# Patient Record
Sex: Male | Born: 1975
Health system: Southern US, Community
[De-identification: ages and names within clinical notes are randomized; demographics above are authoritative.]

## PROBLEM LIST (undated history)

## (undated) DIAGNOSIS — F329 Major depressive disorder, single episode, unspecified: Secondary | ICD-10-CM

## (undated) DIAGNOSIS — R739 Hyperglycemia, unspecified: Secondary | ICD-10-CM

## (undated) DIAGNOSIS — E785 Hyperlipidemia, unspecified: Secondary | ICD-10-CM

## (undated) DIAGNOSIS — F419 Anxiety disorder, unspecified: Secondary | ICD-10-CM

## (undated) DIAGNOSIS — F32A Depression, unspecified: Secondary | ICD-10-CM

## (undated) DIAGNOSIS — K219 Gastro-esophageal reflux disease without esophagitis: Secondary | ICD-10-CM

## (undated) HISTORY — DX: Hyperglycemia, unspecified: R73.9

## (undated) HISTORY — DX: Major depressive disorder, single episode, unspecified: F32.9

## (undated) HISTORY — DX: Hyperlipidemia, unspecified: E78.5

## (undated) HISTORY — PX: NO PAST SURGERIES: SHX2092

## (undated) HISTORY — DX: Depression, unspecified: F32.A

## (undated) HISTORY — DX: Gastro-esophageal reflux disease without esophagitis: K21.9

## (undated) HISTORY — DX: Anxiety disorder, unspecified: F41.9

---

## 1997-09-25 ENCOUNTER — Emergency Department (HOSPITAL_COMMUNITY): Admission: EM | Admit: 1997-09-25 | Discharge: 1997-09-25 | Payer: Self-pay | Admitting: Emergency Medicine

## 1997-10-26 ENCOUNTER — Emergency Department (HOSPITAL_COMMUNITY): Admission: EM | Admit: 1997-10-26 | Discharge: 1997-10-26 | Payer: Self-pay | Admitting: Emergency Medicine

## 1997-10-26 ENCOUNTER — Encounter: Payer: Self-pay | Admitting: Emergency Medicine

## 2011-12-23 ENCOUNTER — Ambulatory Visit: Payer: Self-pay | Admitting: Internal Medicine

## 2012-07-28 ENCOUNTER — Encounter: Payer: Self-pay | Admitting: Internal Medicine

## 2012-07-28 ENCOUNTER — Ambulatory Visit (INDEPENDENT_AMBULATORY_CARE_PROVIDER_SITE_OTHER): Payer: 59 | Admitting: Internal Medicine

## 2012-07-28 VITALS — BP 106/72 | HR 74 | Temp 98.2°F | Ht 72.5 in | Wt 238.0 lb

## 2012-07-28 DIAGNOSIS — E785 Hyperlipidemia, unspecified: Secondary | ICD-10-CM

## 2012-07-28 DIAGNOSIS — R7303 Prediabetes: Secondary | ICD-10-CM

## 2012-07-28 DIAGNOSIS — R739 Hyperglycemia, unspecified: Secondary | ICD-10-CM

## 2012-07-28 DIAGNOSIS — R7309 Other abnormal glucose: Secondary | ICD-10-CM

## 2012-07-28 DIAGNOSIS — Z23 Encounter for immunization: Secondary | ICD-10-CM

## 2012-07-28 DIAGNOSIS — Z0000 Encounter for general adult medical examination without abnormal findings: Secondary | ICD-10-CM

## 2012-07-28 HISTORY — DX: Hyperlipidemia, unspecified: E78.5

## 2012-07-28 HISTORY — DX: Hyperglycemia, unspecified: R73.9

## 2012-07-28 LAB — TSH: TSH: 1.08 u[IU]/mL (ref 0.35–5.50)

## 2012-07-28 LAB — GLUCOSE, POCT (MANUAL RESULT ENTRY): POC Glucose: 76 mg/dl (ref 70–99)

## 2012-07-28 MED ORDER — ROSUVASTATIN CALCIUM 20 MG PO TABS: 20.0000 mg | ORAL_TABLET | Freq: Every day | ORAL | Status: DC

## 2012-07-28 NOTE — Progress Notes (Signed)
  Subjective:    Patient ID: SKIP LITKE, male    DOB: 1975/04/13, 37 y.o.   MRN: [id number]  HPI Complete physical exam, new the patient. The patient noted "cholesterol spots" in his eyes, got  concern, went to a different clinic and fasting labs were done: CBC normal, creatinine 0.9, AST 51, ALT 56 (slt elevated) TC 301, HDL 33, LDL 212, TG 281 A1c 5.8. Blood sugar was 11 (invalid). Was prescribed Lipitor but decided not to take and likes  my opinion.  Past Medical History  Diagnosis Date  . Prediabetes 07/28/2012  . Hyperlipidemia 07/28/2012   Past Surgical History  Procedure Laterality Date  . No past surgeries     History   Social History  . Marital Status: Single    Spouse Name: N/A    Number of Children: 2  . Years of Education: N/A   Occupational History  . sales     Social History Main Topics  . Smoking status: Current Every Day Smoker  . Smokeless tobacco: Never Used     Comment: 1 ppd   . Alcohol Use: No  . Drug Use: No  . Sexually Active: Not on file   Other Topics Concern  . Not on file   Social History Narrative   Original from [place], father was Dimitrius Steedman a pt of mine deceased ~ 02/25/2012   Married, 2 children (one from wife)   finished HS, sales    Family History  Problem Relation Age of Onset  . CAD Father   . Diabetes Father   . Colon cancer Neg Hx   . Prostate cancer Neg Hx      Review of Systems Diet--regular Exercise--used to be very active up to 18 months ago when he changed jobs, now inactive, has gained 30 pounds. No chest pain or shortness or breath No nausea, vomiting, diarrhea or blood in the stools. No dysuria or gross hematuria. No anxiety depression.     Objective:   Physical Exam BP 106/72  Pulse 74  Temp(Src) 98.2 F (36.8 C) (Oral)  Ht 6' 0.5" (1.842 m)  Wt 238 lb (107.956 kg)  BMI 31.82 kg/m2  SpO2 96%  General -- alert, well-developed,NAD  Neck --no thyromegaly  HEENT -- xanthelasma noted B Lungs --  normal respiratory effort, no intercostal retractions, no accessory muscle use, and normal breath sounds.   Heart-- normal rate, regular rhythm, no murmur, and no gallop.   Abdomen--soft, non-tender, no distention, no masses, no HSM, no guarding, and no rigidity.   Extremities-- no pretibial edema bilaterally  Neurologic-- alert & oriented X3 and strength normal in all extremities. Psych-- Cognition and judgment appear intact. Alert and cooperative with normal attention span and concentration.  not anxious appearing and not depressed appearing.      Assessment & Plan:

## 2012-07-28 NOTE — Assessment & Plan Note (Addendum)
Recently found to have hyperlipidemia w/ fasting labs, total cholesterol 301, LDL 212.  He has a family history of heart disease.Has also gained 30 pounds in the last year he had to change in jobs, he has been quite inactive. I like to see his LDL less than 130, in addition to diet and exercise I think he will need medication. Many family members have it scheduled Lipitor, he request Crestor. One of the LFTs was increased, close f/u Plan: crestor 20mg   by mouth each bedtime,samples and rx provided  Labs in 6 weeks: FLP, AST, ALT

## 2012-07-28 NOTE — Assessment & Plan Note (Signed)
A1c 5.8, CBG today 76. Concept of prediabetes discuss, reassess in 4 months when he comes back

## 2012-07-28 NOTE — Assessment & Plan Note (Addendum)
Tdap today Recent labs reviewed, see history HPI Never had a colonoscopy, no family history of colon cancer. STE, diet and exercise discussed. Does have a family history of heart disease, aggressive  Hyperlipidemia rx Check a TSH

## 2012-07-28 NOTE — Patient Instructions (Addendum)
Started Crestor 20 mg one tablet at night. Call if side effects Exercise at least 30 minutes daily, start a gradual program. ----- Please schedule the following: 1. Labs only  in 6 weeks: FLP, AST, ALT, --- dx hyperlipidemia 2. A visit  visit in 4 months.

## 2012-07-31 ENCOUNTER — Encounter: Payer: Self-pay | Admitting: Internal Medicine

## 2012-08-05 ENCOUNTER — Encounter: Payer: Self-pay | Admitting: Internal Medicine

## 2012-08-11 ENCOUNTER — Ambulatory Visit: Payer: Self-pay | Admitting: Internal Medicine

## 2012-09-10 ENCOUNTER — Other Ambulatory Visit: Payer: 59

## 2012-09-21 ENCOUNTER — Other Ambulatory Visit (INDEPENDENT_AMBULATORY_CARE_PROVIDER_SITE_OTHER): Payer: 59

## 2012-09-21 DIAGNOSIS — E785 Hyperlipidemia, unspecified: Secondary | ICD-10-CM

## 2012-09-21 LAB — LIPID PANEL
Cholesterol: 241 mg/dL — ABNORMAL HIGH (ref 0–200)
HDL: 31.5 mg/dL — ABNORMAL LOW (ref >39.00)
Total CHOL/HDL Ratio: 8
Triglycerides: 164 mg/dL — ABNORMAL HIGH (ref 0.0–149.0)
VLDL: 32.8 mg/dL (ref 0.0–40.0)

## 2012-09-21 LAB — AST: AST: 26 U/L (ref 0–37)

## 2012-09-21 LAB — ALT: ALT: 55 U/L — ABNORMAL HIGH (ref 0–53)

## 2012-09-21 LAB — LDL CHOLESTEROL, DIRECT: Direct LDL: 179.2 mg/dL

## 2012-09-24 ENCOUNTER — Telehealth: Payer: Self-pay | Admitting: *Deleted

## 2012-09-24 DIAGNOSIS — E785 Hyperlipidemia, unspecified: Secondary | ICD-10-CM

## 2012-09-24 MED ORDER — ROSUVASTATIN CALCIUM 40 MG PO TABS: 40.0000 mg | ORAL_TABLET | Freq: Every day | ORAL | Status: DC

## 2012-09-24 NOTE — Telephone Encounter (Signed)
Message copied by Shirlee More I on Thu Sep 24, 2012 10:03 AM ------      Message from: Willow Ora E      Created: Wed Sep 23, 2012  4:44 PM       Recent baseline labs:  AST 51, ALT 56 (slt elevated), TC 301, HDL 33, LDL 212, TG 281      He is now on Crestor 20 mg.      Advise patient:      Crestor has definitely improved his numbers, cholesterol is now 241, LDL 179 but is still not as good as he likes to be(LDL goal <130)      Recommend to increase Crestor from 20 mg to 40 mg 1 po qd      FLP, AST, ALT in 2 months.      Send a new prescription, #90 and 1 refills.       ------

## 2012-09-24 NOTE — Telephone Encounter (Signed)
Discussed with patient, verbalized understanding. Orders placed. Lab apt. Scheduled for 11/27/12 at 0900.

## 2012-11-27 ENCOUNTER — Other Ambulatory Visit: Payer: 59

## 2012-11-30 ENCOUNTER — Telehealth: Payer: Self-pay | Admitting: Internal Medicine

## 2012-11-30 DIAGNOSIS — E785 Hyperlipidemia, unspecified: Secondary | ICD-10-CM

## 2012-11-30 NOTE — Telephone Encounter (Signed)
Advise patient, he is due for labs to check his cholesterol  FLP, AST, ALT dx Lipidemia Please arrange

## 2012-12-10 NOTE — Telephone Encounter (Signed)
Pt notified scheduled for 12/11/12

## 2012-12-10 NOTE — Telephone Encounter (Signed)
Pt needs to be called

## 2012-12-11 ENCOUNTER — Other Ambulatory Visit (INDEPENDENT_AMBULATORY_CARE_PROVIDER_SITE_OTHER): Payer: 59

## 2012-12-11 DIAGNOSIS — E785 Hyperlipidemia, unspecified: Secondary | ICD-10-CM

## 2012-12-11 LAB — LIPID PANEL
Cholesterol: 252 mg/dL — ABNORMAL HIGH (ref 0–200)
Total CHOL/HDL Ratio: 8

## 2013-01-08 ENCOUNTER — Encounter: Payer: Self-pay | Admitting: Internal Medicine

## 2013-01-08 ENCOUNTER — Ambulatory Visit (INDEPENDENT_AMBULATORY_CARE_PROVIDER_SITE_OTHER): Payer: 59 | Admitting: Internal Medicine

## 2013-01-08 VITALS — BP 118/75 | HR 73 | Temp 98.0°F | Wt 245.0 lb

## 2013-01-08 DIAGNOSIS — E785 Hyperlipidemia, unspecified: Secondary | ICD-10-CM

## 2013-01-08 MED ORDER — EZETIMIBE 10 MG PO TABS: 10.0000 mg | ORAL_TABLET | Freq: Every day | ORAL | Status: DC

## 2013-01-08 MED ORDER — PRAVASTATIN SODIUM 20 MG PO TABS: 20.0000 mg | ORAL_TABLET | Freq: Every day | ORAL | Status: DC

## 2013-01-08 NOTE — Patient Instructions (Signed)
Schedule labs to be done on in 6 weeks: FLP, AST, ALT hyperlipidemia  Next visit with me in 6 months, fasting. In addition to zetia-Pravachol, start a supplement called CO Q 10 once daily.  The [facility] web site for Diabetes has also great information about cholesterol StagedNews.no  The American Heart Association is a great resource online at:  Mormon101.pl

## 2013-01-08 NOTE — Progress Notes (Signed)
Pre visit review using our clinic review tool, if applicable. No additional management support is needed unless otherwise documented below in the visit note. 

## 2013-01-08 NOTE — Progress Notes (Signed)
   Subjective:    Patient ID: Gregory Gilmore, male    DOB: 05/11/1975, 37 y.o.   MRN: [id number]  HPI Followup visit. Based on the last cholesterol panel he started Crestor, developed muscle spasms consistently when he took the medication, he was asymptomatic without  it. The last cholesterol panel did not show any improvement but he was hardly taking any crestor.  Past Medical History  Diagnosis Date  . Prediabetes 07/28/2012  . Hyperlipidemia 07/28/2012   Past Surgical History  Procedure Laterality Date  . No past surgeries      Review of Systems Diet--has not changed He has improved some his physical activity    Objective:   Physical Exam BP 118/75  Pulse 73  Temp(Src) 98 F (36.7 C)  Wt 245 lb (111.131 kg)  SpO2 97% General -- alert, well-developed, NAD.  Psych-- Cognition and judgment appear intact. Cooperative with normal attention span and concentration. No anxious appearing , no depressed appearing.      Assessment & Plan:

## 2013-01-08 NOTE — Assessment & Plan Note (Signed)
We reviewed his last two cholesterol panels, he is definitely intolerant to Crestor and needs aggressive cholesterol management. I counseled again about diet and exercise. I encouraged self learning as he is unable to see a nutritionist due to time constraints. Plan: Discontinue Crestor, start Pravachol, Zetia, CoQ10. See instructions. Today , I spent more than 15  min with the patient, >50% of the time counseling

## 2013-01-09 ENCOUNTER — Encounter: Payer: Self-pay | Admitting: Internal Medicine

## 2013-02-19 ENCOUNTER — Other Ambulatory Visit: Payer: 59

## 2013-04-23 ENCOUNTER — Emergency Department (HOSPITAL_BASED_OUTPATIENT_CLINIC_OR_DEPARTMENT_OTHER)
Admission: EM | Admit: 2013-04-23 | Discharge: 2013-04-23 | Disposition: A | Discharge status: Home / Self Care | Payer: 59 | Attending: Emergency Medicine | Admitting: Emergency Medicine

## 2013-04-23 ENCOUNTER — Encounter (HOSPITAL_BASED_OUTPATIENT_CLINIC_OR_DEPARTMENT_OTHER): Payer: Self-pay | Admitting: Emergency Medicine

## 2013-04-23 ENCOUNTER — Emergency Department (HOSPITAL_BASED_OUTPATIENT_CLINIC_OR_DEPARTMENT_OTHER): Payer: 59

## 2013-04-23 DIAGNOSIS — Z79899 Other long term (current) drug therapy: Secondary | ICD-10-CM | POA: Insufficient documentation

## 2013-04-23 DIAGNOSIS — R079 Chest pain, unspecified: Secondary | ICD-10-CM

## 2013-04-23 DIAGNOSIS — F17200 Nicotine dependence, unspecified, uncomplicated: Secondary | ICD-10-CM | POA: Insufficient documentation

## 2013-04-23 DIAGNOSIS — E785 Hyperlipidemia, unspecified: Secondary | ICD-10-CM | POA: Insufficient documentation

## 2013-04-23 DIAGNOSIS — I10 Essential (primary) hypertension: Secondary | ICD-10-CM | POA: Insufficient documentation

## 2013-04-23 LAB — CBC WITH DIFFERENTIAL/PLATELET
Basophils Absolute: 0 10*3/uL (ref 0.0–0.1)
Basophils Relative: 0 % (ref 0–1)
EOS ABS: 0.2 10*3/uL (ref 0.0–0.7)
Eosinophils Relative: 2 % (ref 0–5)
HCT: 44.8 % (ref 39.0–52.0)
Hemoglobin: 15.9 g/dL (ref 13.0–17.0)
LYMPHS ABS: 2.8 10*3/uL (ref 0.7–4.0)
Lymphocytes Relative: 40 % (ref 12–46)
MCH: 33.3 pg (ref 26.0–34.0)
MCHC: 35.5 g/dL (ref 30.0–36.0)
MCV: 93.7 fL (ref 78.0–100.0)
MONOS PCT: 9 % (ref 3–12)
Monocytes Absolute: 0.6 10*3/uL (ref 0.1–1.0)
NEUTROS ABS: 3.3 10*3/uL (ref 1.7–7.7)
NEUTROS PCT: 48 % (ref 43–77)
PLATELETS: 217 10*3/uL (ref 150–400)
RBC: 4.78 MIL/uL (ref 4.22–5.81)
RDW: 12.4 % (ref 11.5–15.5)
WBC: 6.8 10*3/uL (ref 4.0–10.5)

## 2013-04-23 LAB — BASIC METABOLIC PANEL
BUN: 15 mg/dL (ref 6–23)
CO2: 27 mEq/L (ref 19–32)
Calcium: 9.5 mg/dL (ref 8.4–10.5)
Chloride: 101 mEq/L (ref 96–112)
Creatinine, Ser: 1 mg/dL (ref 0.50–1.35)
GFR calc Af Amer: >90 mL/min (ref >90)
Glucose, Bld: 109 mg/dL — ABNORMAL HIGH (ref 70–99)
POTASSIUM: 4.3 meq/L (ref 3.7–5.3)
SODIUM: 140 meq/L (ref 137–147)

## 2013-04-23 LAB — TROPONIN I: Troponin I: <0.3 ng/mL (ref <0.30)

## 2013-04-23 MED ORDER — ASPIRIN 81 MG PO CHEW
324.0000 mg | CHEWABLE_TABLET | Freq: Once | ORAL | Status: AC
  Administered 2013-04-23: 324 mg via ORAL
  Filled 2013-04-23: qty 4

## 2013-04-23 NOTE — ED Notes (Signed)
Chest pain intermittent for three days but remains constant today. Denies other symptoms. States pain in chest wall area. Denies radiating pain.

## 2013-04-23 NOTE — Discharge Instructions (Signed)
Chest Pain (Nonspecific) °It is often hard to give a specific diagnosis for the cause of chest pain. There is always a chance that your pain could be related to something serious, such as a heart attack or a blood clot in the lungs. You need to follow up with your caregiver for further evaluation. °CAUSES  °· Heartburn. °· Pneumonia or bronchitis. °· Anxiety or stress. °· Inflammation around your heart (pericarditis) or lung (pleuritis or pleurisy). °· A blood clot in the lung. °· A collapsed lung (pneumothorax). It can develop suddenly on its own (spontaneous pneumothorax) or from injury (trauma) to the chest. °· Shingles infection (herpes zoster virus). °The chest wall is composed of bones, muscles, and cartilage. Any of these can be the source of the pain. °· The bones can be bruised by injury. °· The muscles or cartilage can be strained by coughing or overwork. °· The cartilage can be affected by inflammation and become sore (costochondritis). °DIAGNOSIS  °Lab tests or other studies, such as X-rays, electrocardiography, stress testing, or cardiac imaging, may be needed to find the cause of your pain.  °TREATMENT  °· Treatment depends on what may be causing your chest pain. Treatment may include: °· Acid blockers for heartburn. °· Anti-inflammatory medicine. °· Pain medicine for inflammatory conditions. °· Antibiotics if an infection is present. °· You may be advised to change lifestyle habits. This includes stopping smoking and avoiding alcohol, caffeine, and chocolate. °· You may be advised to keep your head raised (elevated) when sleeping. This reduces the chance of acid going backward from your stomach into your esophagus. °· Most of the time, nonspecific chest pain will improve within 2 to 3 days with rest and mild pain medicine. °HOME CARE INSTRUCTIONS  °· If antibiotics were prescribed, take your antibiotics as directed. Finish them even if you start to feel better. °· For the next few days, avoid physical  activities that bring on chest pain. Continue physical activities as directed. °· Do not smoke. °· Avoid drinking alcohol. °· Only take over-the-counter or prescription medicine for pain, discomfort, or fever as directed by your caregiver. °· Follow your caregiver's suggestions for further testing if your chest pain does not go away. °· Keep any follow-up appointments you made. If you do not go to an appointment, you could develop lasting (chronic) problems with pain. If there is any problem keeping an appointment, you must call to reschedule. °SEEK MEDICAL CARE IF:  °· You think you are having problems from the medicine you are taking. Read your medicine instructions carefully. °· Your chest pain does not go away, even after treatment. °· You develop a rash with blisters on your chest. °SEEK IMMEDIATE MEDICAL CARE IF:  °· You have increased chest pain or pain that spreads to your arm, neck, jaw, back, or abdomen. °· You develop shortness of breath, an increasing cough, or you are coughing up blood. °· You have severe back or abdominal pain, feel nauseous, or vomit. °· You develop severe weakness, fainting, or chills. °· You have a fever. °THIS IS AN EMERGENCY. Do not wait to see if the pain will go away. Get medical help at once. Call your local emergency services (911 in [place] Do not drive yourself to the hospital. °MAKE SURE YOU:  °· Understand these instructions. °· Will watch your condition. °· Will get help right away if you are not doing well or get worse. °Document Released: 10/31/2004 Document Revised: 04/15/2011 Document Reviewed: 08/27/2007 °ExitCare® Patient Information ©2014 [employer]   LL[employer]. ° °

## 2013-04-23 NOTE — ED Provider Notes (Signed)
CSN: [id number]     Arrival date & time 04/23/13  1023 History   First MD Initiated Contact with Patient 04/23/13 1025     Chief Complaint  Patient presents with  . Chest Pain     (Consider location/radiation/quality/duration/timing/severity/associated sxs/prior Treatment) Patient is a 38 y.o. male presenting with chest pain.  Chest Pain  Pt with history of HLD (not compliant with meds) and tobacco abuse but no known CAD reports mild diffuse chest soreness since starting volleyball practice 3 days ago. Pain comes and goes. No particular provoking or relieving factors. Non-radiating, not associated with SOB, diaphoresis or nausea. No exertional component.   Past Medical History  Diagnosis Date  . Prediabetes 07/28/2012  . Hyperlipidemia 07/28/2012  . Hypertension    Past Surgical History  Procedure Laterality Date  . No past surgeries     Family History  Problem Relation Age of Onset  . CAD Father   . Diabetes Father   . Colon cancer Neg Hx   . Prostate cancer Neg Hx    History  Substance Use Topics  . Smoking status: Current Every Day Smoker  . Smokeless tobacco: Never Used     Comment: 1 ppd   . Alcohol Use: No    Review of Systems  Cardiovascular: Positive for chest pain.   All other systems reviewed and are negative except as noted in HPI.     Allergies  Review of patient's allergies indicates no known allergies.  Home Medications   Current Outpatient Rx  Name  Route  Sig  Dispense  Refill  . ezetimibe (ZETIA) 10 MG tablet   Oral   Take 1 tablet (10 mg total) by mouth daily.   90 tablet   1   . pravastatin (PRAVACHOL) 20 MG tablet   Oral   Take 1 tablet (20 mg total) by mouth daily.   90 tablet   1    BP 142/67  Pulse 74  Temp(Src) 98.1 F (36.7 C) (Oral)  Resp 18  Wt 245 lb (111.131 kg)  SpO2 100% Physical Exam  Nursing note and vitals reviewed. Constitutional: He is oriented to person, place, and time. He appears well-developed and  well-nourished.  HENT:  Head: Normocephalic and atraumatic.  Eyes: EOM are normal. Pupils are equal, round, and reactive to light.  Neck: Normal range of motion. Neck supple.  Cardiovascular: Normal rate, normal heart sounds and intact distal pulses.   Pulmonary/Chest: Effort normal and breath sounds normal. He has no wheezes. He has no rales. He exhibits no tenderness.  Abdominal: Bowel sounds are normal. He exhibits no distension. There is no tenderness.  Musculoskeletal: Normal range of motion. He exhibits no edema and no tenderness.  Neurological: He is alert and oriented to person, place, and time. He has normal strength. No cranial nerve deficit or sensory deficit.  Skin: Skin is warm and dry. No rash noted.  Psychiatric: He has a normal mood and affect.    ED Course  Procedures (including critical care time) Labs Review Labs Reviewed  BASIC METABOLIC PANEL - Abnormal; Notable for the following:    Glucose, Bld 109 (*)    All other components within normal limits  CBC WITH DIFFERENTIAL  TROPONIN I   Imaging Review Dg Chest 2 View  04/23/2013   CLINICAL DATA:  Chest pain  EXAM: CHEST  2 VIEW  COMPARISON:  09/25/2012  FINDINGS: The heart size and mediastinal contours are within normal limits. Both lungs are clear. The visualized  skeletal structures are unremarkable.  IMPRESSION: No active cardiopulmonary disease.   Electronically Signed   By: Alcide CleverMark  Lukens M.D.   On: 04/23/2013 11:16     EKG Interpretation   Date/Time:  Friday April 23 2013 10:27:58 EDT Ventricular Rate:  70 PR Interval:  152 QRS Duration: 92 QT Interval:  372 QTC Calculation: 401 R Axis:   25 Text Interpretation:  Normal sinus rhythm Normal ECG No old tracing to  compare Confirmed by [facility] CenterHELDON  MD, Leonette MostHARLES [phone]([id number]) on 04/23/2013 10:30:38 AM      MDM   Final diagnoses:  Chest pain    Labs imaging and EKG here are unremarkable. Atypical pain ongoing for 3 days in low risk patient. Doubt this is ACS/MI.  Advised PCP followup if not improving. Return to the ED if symptoms worsen.    Aldan Camey B. Bernette MayersSheldon, MD 04/23/13 1149

## 2013-09-29 ENCOUNTER — Ambulatory Visit (INDEPENDENT_AMBULATORY_CARE_PROVIDER_SITE_OTHER): Payer: 59 | Admitting: Internal Medicine

## 2013-09-29 ENCOUNTER — Encounter: Payer: Self-pay | Admitting: Internal Medicine

## 2013-09-29 VITALS — BP 132/72 | HR 62 | Temp 98.4°F | Wt 239.4 lb

## 2013-09-29 DIAGNOSIS — R7309 Other abnormal glucose: Secondary | ICD-10-CM

## 2013-09-29 DIAGNOSIS — E785 Hyperlipidemia, unspecified: Secondary | ICD-10-CM

## 2013-09-29 DIAGNOSIS — R739 Hyperglycemia, unspecified: Secondary | ICD-10-CM

## 2013-09-29 DIAGNOSIS — R079 Chest pain, unspecified: Secondary | ICD-10-CM

## 2013-09-29 DIAGNOSIS — F419 Anxiety disorder, unspecified: Secondary | ICD-10-CM

## 2013-09-29 DIAGNOSIS — F411 Generalized anxiety disorder: Secondary | ICD-10-CM

## 2013-09-29 DIAGNOSIS — R7303 Prediabetes: Secondary | ICD-10-CM

## 2013-09-29 MED ORDER — EZETIMIBE 10 MG PO TABS: 10.0000 mg | ORAL_TABLET | Freq: Every day | ORAL | Status: DC

## 2013-09-29 MED ORDER — PRAVASTATIN SODIUM 20 MG PO TABS: 20.0000 mg | ORAL_TABLET | Freq: Every day | ORAL | Status: DC

## 2013-09-29 NOTE — Progress Notes (Signed)
Pre-visit discussion using our clinic review tool. No additional management support is needed unless otherwise documented below in the visit note.  

## 2013-09-29 NOTE — Patient Instructions (Addendum)
Please consider see one of our counselors  Talk with the front desk, sign a Release of information : Need all  records from the recent ER visit at [facility]  Take the medications as prescribed Schedule labs to be done  6 weeks from today: FLP, AST, ALT-- high choleterol

## 2013-09-29 NOTE — Assessment & Plan Note (Addendum)
Not taking any medication recently, will send a RF, we agreed to take the medication and check labs in 6 weeks

## 2013-09-29 NOTE — Assessment & Plan Note (Addendum)
Chest pain, Atypical, will get records from recent ER visit included a stress test which he was told was normal Records reviewed, see above

## 2013-09-29 NOTE — Progress Notes (Addendum)
Subjective:    Patient ID: Gregory Gilmore, male    DOB: 1975-05-20, 38 y.o.   MRN: [id number]  DOS:  09/29/2013 Type of visit - description: several concerns History: Having chest pain, " it is not really pain or pressure is like a hand lying on top of my chest all the time". Went to the ER 04-2013 and few weeks ago. During the last visit that Gregory Gilmore he had a full workup including a stress test and it was negative per pt .  Also concerned about his left arm, it goes numb from the elbow to the wrist, on-off, usually when he drives his car w/ his arm hanging out the window. Denies neck pain, swelling or rash  During the last ER visit, his wife mentioned anxiety, he states that he is not really anxious or angry but recognizes he is a very intense person,  high strung and has been that way all his life. Denies problems with depression, irritation, and anger management. When asked, admits to poor sleep, usually 4 hours only, feels unrested and has to take a nap every day.    Past Medical History  Diagnosis Date  . Prediabetes 07/28/2012  . Hyperlipidemia 07/28/2012  . Hypertension   . Smoker     Past Surgical History  Procedure Laterality Date  . No past surgeries      History   Social History  . Marital Status: Single    Spouse Name: N/A    Number of Children: 2  . Years of Education: N/A   Occupational History  . sales     Social History Main Topics  . Smoking status: Current Every Day Smoker  . Smokeless tobacco: Never Used     Comment: 1 ppd   . Alcohol Use: No  . Drug Use: No  . Sexual Activity: Not on file   Other Topics Concern  . Not on file   Social History Narrative   Original from [place], father was Gregory Gilmore a pt of mine deceased ~ 04-01-12   Married, 2 children (one from wife)   finished HS, sales         Medication List       This list is accurate as of: 09/29/13  5:47 PM.  Always use your most recent med list.               ezetimibe 10 MG tablet  Commonly known as:  ZETIA  Take 1 tablet (10 mg total) by mouth daily.     pravastatin 20 MG tablet  Commonly known as:  PRAVACHOL  Take 1 tablet (20 mg total) by mouth daily.           Objective:   Physical Exam BP 132/72  Pulse 62  Temp(Src) 98.4 F (36.9 C) (Oral)  Wt 239 lb 6 oz (108.58 kg)  SpO2 97% General -- alert, well-developed, NAD.  Neck --no TTP, normal ROM Lungs -- normal respiratory effort, no intercostal retractions, no accessory muscle use, and normal breath sounds.  Heart-- normal rate, regular rhythm, no murmur.  Extremities-- no pretibial edema bilaterally  Neurologic--  alert & oriented X3. Speech normal, gait appropriate for age, strength symmetric and appropriate for age.  DTRs symmetric. Psych-- Cognition and judgment appear intact. Cooperative with normal attention span and concentration. No anxious or depressed appearing.     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 Recommend avoidance of situations that trigger the symptoms, if not better will me know.  Addendum, hospital records reviewed: Admit 09/20/2013 w/ chest pain Troponins negative, D-dimer was elevated,CT angiogram negative for PE, neg  Lexiscan. Patient was discharged on Crestor.

## 2013-09-29 NOTE — Assessment & Plan Note (Addendum)
Question of anxiety,  poor sleep. I did a epworth  sleepiness scale and scored 7 --->  average. Not clear to me if he is really anxious or  simply has an intense personality. Recommend evaluation by a counselor,   not sure if medication is appropriate

## 2013-10-07 DIAGNOSIS — R739 Hyperglycemia, unspecified: Secondary | ICD-10-CM | POA: Insufficient documentation

## 2013-10-07 NOTE — Assessment & Plan Note (Signed)
Addendum, A1c 6.0 when he was at the hospital.

## 2013-11-12 ENCOUNTER — Other Ambulatory Visit: Payer: 59

## 2013-11-19 ENCOUNTER — Ambulatory Visit (INDEPENDENT_AMBULATORY_CARE_PROVIDER_SITE_OTHER): Payer: 59 | Admitting: Internal Medicine

## 2013-11-19 ENCOUNTER — Encounter: Payer: Self-pay | Admitting: Internal Medicine

## 2013-11-19 VITALS — BP 128/80 | HR 79 | Temp 98.2°F | Wt 241.5 lb

## 2013-11-19 DIAGNOSIS — E785 Hyperlipidemia, unspecified: Secondary | ICD-10-CM

## 2013-11-19 DIAGNOSIS — R079 Chest pain, unspecified: Secondary | ICD-10-CM

## 2013-11-19 LAB — LIPID PANEL
CHOL/HDL RATIO: 10
CHOLESTEROL: 258 mg/dL — AB (ref 0–200)
HDL: 24.6 mg/dL — AB (ref >39.00)
NonHDL: 233.4
TRIGLYCERIDES: 273 mg/dL — AB (ref 0.0–149.0)
VLDL: 54.6 mg/dL — ABNORMAL HIGH (ref 0.0–40.0)

## 2013-11-19 LAB — LDL CHOLESTEROL, DIRECT: LDL DIRECT: 179.3 mg/dL

## 2013-11-19 LAB — AST: AST: 22 U/L (ref 0–37)

## 2013-11-19 LAB — ALT: ALT: 39 U/L (ref 0–53)

## 2013-11-19 NOTE — Patient Instructions (Addendum)
Get your blood work before you leave    Please come back to the office in 6 months  for a check up Come back fasting

## 2013-11-19 NOTE — Progress Notes (Signed)
Pre visit review using our clinic review tool, if applicable. No additional management support is needed unless otherwise documented below in the visit note. 

## 2013-11-19 NOTE — Progress Notes (Signed)
   Subjective:    Patient ID: Gregory Gilmore, male    DOB: 09-Apr-1975, 38 y.o.   MRN: [id number]  DOS:  11/19/2013 Type of visit - description : acute Interval history: He was here for labs but also reports chest pain: ~ 2 weeks ago he started to play volleyball again and developed soreness in the anterior chest and epigastrium, he actually thinks is related to restart playing volleyball. Also concerned about his cholesterol goals. Good compliance with medication without apparent s/e   ROS Denies difficulty breathing. No cough or wheezing. No nausea, vomiting, diarrhea  Past Medical History  Diagnosis Date  . Prediabetes 07/28/2012  . Hyperlipidemia 07/28/2012  . Hypertension   . Smoker     Past Surgical History  Procedure Laterality Date  . No past surgeries      History   Social History  . Marital Status: Single    Spouse Name: N/A    Number of Children: 2  . Years of Education: N/A   Occupational History  . sales     Social History Main Topics  . Smoking status: Current Every Day Smoker  . Smokeless tobacco: Never Used     Comment: 1 ppd   . Alcohol Use: No  . Drug Use: No  . Sexual Activity: Not on file   Other Topics Concern  . Not on file   Social History Narrative   [place], father was Hal Neerarl Jambor a pt of mine deceased ~ 02-2012   Married, 2 children (one from wife)   finished HS, sales         Medication List       This list is accurate as of: 11/19/13 11:59 PM.  Always use your most recent med list.               ezetimibe 10 MG tablet  Commonly known as:  ZETIA  Take 1 tablet (10 mg total) by mouth daily.     pravastatin 20 MG tablet  Commonly known as:  PRAVACHOL  Take 1 tablet (20 mg total) by mouth daily.           Objective:   Physical Exam BP 128/80  Pulse 79  Temp(Src) 98.2 F (36.8 C) (Oral)  Wt 241 lb 8 oz (109.544 kg)  SpO2 93% General -- alert, well-developed, NAD.   Lungs -- normal respiratory  effort, no intercostal retractions, no accessory muscle use, and normal breath sounds.  Heart-- normal rate, regular rhythm, no murmur.  Abdomen-- Not distended, good bowel sounds,soft, non-tender. Extremities-- no pretibial edema bilaterally  Neurologic--  alert & oriented X3.   Psych-- Cognition and judgment appear intact. Cooperative with normal attention span and concentration. No anxious or depressed appearing.         Assessment & Plan:

## 2013-11-19 NOTE — Assessment & Plan Note (Signed)
Good compliance with Pravachol and Zetia, goals discussed, LDL should be less than 130 given family history and tobacco abuse. Discussed previous results . Check  Labs

## 2013-11-19 NOTE — Assessment & Plan Note (Signed)
See history of present illness, chest pain unlikely to represent something serious, likely muscle skeletal pain

## 2013-11-22 ENCOUNTER — Telehealth: Payer: Self-pay | Admitting: Internal Medicine

## 2013-11-22 NOTE — Telephone Encounter (Signed)
emmi emailed °

## 2013-11-24 MED ORDER — ATORVASTATIN CALCIUM 40 MG PO TABS: 40.0000 mg | ORAL_TABLET | Freq: Every day | ORAL | Status: DC

## 2013-11-24 NOTE — Addendum Note (Signed)
Addended by: Christin FudgeFAULKNER, Lashan Gluth C on: 11/24/2013 10:20 AM   Modules accepted: Orders, Medications

## 2014-01-31 ENCOUNTER — Other Ambulatory Visit: Payer: 59

## 2014-02-05 ENCOUNTER — Telehealth: Payer: Self-pay | Admitting: Internal Medicine

## 2014-02-05 NOTE — Telephone Encounter (Signed)
Patient started a cholesterol medication, due for labs, please arrange

## 2014-02-07 NOTE — Telephone Encounter (Signed)
Letter printed and mailed to Pt informing him that he is due for cholesterol recheck. FLP, AST, ALT ordered.

## 2015-12-02 DIAGNOSIS — F419 Anxiety disorder, unspecified: Secondary | ICD-10-CM | POA: Insufficient documentation

## 2015-12-02 DIAGNOSIS — F411 Generalized anxiety disorder: Secondary | ICD-10-CM | POA: Insufficient documentation

## 2015-12-02 DIAGNOSIS — F32A Depression, unspecified: Secondary | ICD-10-CM | POA: Insufficient documentation

## 2016-05-09 DIAGNOSIS — R5383 Other fatigue: Secondary | ICD-10-CM | POA: Diagnosis not present

## 2016-05-09 DIAGNOSIS — Z833 Family history of diabetes mellitus: Secondary | ICD-10-CM | POA: Diagnosis not present

## 2016-05-09 DIAGNOSIS — E782 Mixed hyperlipidemia: Secondary | ICD-10-CM | POA: Diagnosis not present

## 2016-05-09 DIAGNOSIS — Z130 Encounter for screening for diseases of the blood and blood-forming organs and certain disorders involving the immune mechanism: Secondary | ICD-10-CM | POA: Diagnosis not present

## 2016-05-09 DIAGNOSIS — Z1329 Encounter for screening for other suspected endocrine disorder: Secondary | ICD-10-CM | POA: Diagnosis not present

## 2016-05-10 DIAGNOSIS — F321 Major depressive disorder, single episode, moderate: Secondary | ICD-10-CM | POA: Insufficient documentation

## 2016-07-23 DIAGNOSIS — L02416 Cutaneous abscess of left lower limb: Secondary | ICD-10-CM | POA: Diagnosis not present

## 2016-07-23 DIAGNOSIS — L731 Pseudofolliculitis barbae: Secondary | ICD-10-CM | POA: Diagnosis not present

## 2016-10-09 DIAGNOSIS — R7303 Prediabetes: Secondary | ICD-10-CM | POA: Diagnosis not present

## 2016-10-09 DIAGNOSIS — E782 Mixed hyperlipidemia: Secondary | ICD-10-CM | POA: Diagnosis not present

## 2017-01-06 DIAGNOSIS — K219 Gastro-esophageal reflux disease without esophagitis: Secondary | ICD-10-CM | POA: Insufficient documentation

## 2017-02-10 DIAGNOSIS — M5430 Sciatica, unspecified side: Secondary | ICD-10-CM | POA: Diagnosis not present

## 2017-02-10 DIAGNOSIS — R109 Unspecified abdominal pain: Secondary | ICD-10-CM | POA: Diagnosis not present

## 2017-02-10 DIAGNOSIS — R8290 Unspecified abnormal findings in urine: Secondary | ICD-10-CM | POA: Diagnosis not present

## 2017-02-10 DIAGNOSIS — M549 Dorsalgia, unspecified: Secondary | ICD-10-CM | POA: Diagnosis not present

## 2017-02-12 ENCOUNTER — Telehealth: Payer: Self-pay | Admitting: *Deleted

## 2017-02-12 NOTE — Telephone Encounter (Signed)
Called pt to inform him that Dr Drue NovelPaz aproved his request to re-establish care. LVM for him to call and schedule a NP appt.

## 2017-02-12 NOTE — Telephone Encounter (Signed)
Please advise 

## 2017-02-12 NOTE — Telephone Encounter (Signed)
Okay to scheduled NP appt at Pt's convenience. Thank you.

## 2017-02-12 NOTE — Telephone Encounter (Signed)
Copied from CRM [id number]. Topic: Appointment Scheduling - Scheduling Inquiry for Clinic >> Feb 12, 2017 10:14 AM Crist InfanteHarrald, Kathy J wrote: Reason for CRM: pt would like to know if Dr Drue NovelPaz will see him again, since it has been over 3 yrs since he was seen.  (Last OV 11/19/2013) Pt states his brother Crissie ReeseRobert Nembhard and his dad (deceased) Hal Neerarl Boultinghouse saw Dr Drue NovelPaz as well.  Is that ok?

## 2017-02-12 NOTE — Telephone Encounter (Signed)
That is okay, schedule an appointment at his convenience

## 2017-02-18 ENCOUNTER — Ambulatory Visit: Payer: 59 | Admitting: Internal Medicine

## 2017-02-18 ENCOUNTER — Encounter: Payer: Self-pay | Admitting: Internal Medicine

## 2017-02-18 VITALS — BP 132/78 | HR 88 | Temp 98.1°F | Resp 14 | Ht 74.0 in | Wt 251.2 lb

## 2017-02-18 DIAGNOSIS — R079 Chest pain, unspecified: Secondary | ICD-10-CM | POA: Diagnosis not present

## 2017-02-18 DIAGNOSIS — R809 Proteinuria, unspecified: Secondary | ICD-10-CM

## 2017-02-18 DIAGNOSIS — K219 Gastro-esophageal reflux disease without esophagitis: Secondary | ICD-10-CM | POA: Diagnosis not present

## 2017-02-18 DIAGNOSIS — Z09 Encounter for follow-up examination after completed treatment for conditions other than malignant neoplasm: Secondary | ICD-10-CM | POA: Insufficient documentation

## 2017-02-18 DIAGNOSIS — E785 Hyperlipidemia, unspecified: Secondary | ICD-10-CM

## 2017-02-18 DIAGNOSIS — F411 Generalized anxiety disorder: Secondary | ICD-10-CM | POA: Diagnosis not present

## 2017-02-18 LAB — URINALYSIS, ROUTINE W REFLEX MICROSCOPIC
BILIRUBIN URINE: NEGATIVE
Ketones, ur: NEGATIVE
Leukocytes, UA: NEGATIVE
Nitrite: NEGATIVE
RBC / HPF: NONE SEEN (ref 0–?)
Specific Gravity, Urine: 1.025 (ref 1.000–1.030)
Total Protein, Urine: NEGATIVE
Urine Glucose: NEGATIVE
Urobilinogen, UA: 0.2 (ref 0.0–1.0)
pH: 6 (ref 5.0–8.0)

## 2017-02-18 LAB — MICROALBUMIN / CREATININE URINE RATIO
Creatinine,U: 282.3 mg/dL
MICROALB/CREAT RATIO: 0.4 mg/g (ref 0.0–30.0)
Microalb, Ur: 1.1 mg/dL (ref 0.0–1.9)

## 2017-02-18 MED ORDER — ROSUVASTATIN CALCIUM 20 MG PO TABS: 20.0000 mg | ORAL_TABLET | Freq: Every day | ORAL | 3 refills | Status: DC

## 2017-02-18 NOTE — Patient Instructions (Signed)
GO TO THE LAB : Provide a urine sample  GO TO THE FRONT DESK Schedule your next appointment for a  Physical exam in 3 months , fasting

## 2017-02-18 NOTE — Progress Notes (Signed)
Subjective:    Patient ID: Gregory Gilmore, male    DOB: 11-01-1975, 42 y.o.   MRN: [id number]  DOS:  02/18/2017 Type of visit - description : new  patient, last visit 2015 Interval history: Here to get reestablished, has several concerns: High cholesterol, has not been taking  Crestor recently and wonders if he needs to be taking that. Anxiety, depression: Since the last time I saw him, symptoms continue, has been managed by primary providers and reports he saw psychiatry one time.  Chart is reviewed and summarized below. Had right-sided back pain, went to see his primary provider, in the process they checked a urine, they found protein, pt is very concerned.   Back pain is gone . He also found a knot at the left flank.  Nontender.   Review of Systems Denies fever, chills. No change in the color of the urine.  No dysuria or gross hematuria Your recent increase in  blood pressure. No taking NSAIDs  Past Medical History:  Diagnosis Date  . Anxiety and depression   . GERD (gastroesophageal reflux disease)   . Hyperglycemia 07/28/2012  . Hyperlipidemia 07/28/2012    Past Surgical History:  Procedure Laterality Date  . NO PAST SURGERIES      Social History   Socioeconomic History  . Marital status: Married    Spouse name: Not on file  . Number of children: 2  . Years of education: Not on file  . Highest education level: Not on file  Social Needs  . Financial resource strain: Not on file  . Food insecurity - worry: Not on file  . Food insecurity - inability: Not on file  . Transportation needs - medical: Not on file  . Transportation needs - non-medical: Not on file  Occupational History  . Occupation: Airline pilot   Tobacco Use  . Smoking status: Current Every Day Smoker  . Smokeless tobacco: Never Used  . Tobacco comment: 1 ppd   Substance and Sexual Activity  . Alcohol use: No  . Drug use: No  . Sexual activity: Not on file  Other Topics Concern  . Not on file    Social History Narrative   Original from [place], father was Noha Karasik a pt of mine deceased ~ 2012/02/28   Married, 2 children (one from wife)   finished HS, sales      Family History  Problem Relation Age of Onset  . CAD Father        cabg 41 y/o  . Diabetes Father   . Colon cancer Neg Hx   . Prostate cancer Neg Hx      Allergies as of 02/18/2017   No Known Allergies     Medication List        Accurate as of 02/18/17  1:14 PM. Always use your most recent med list.          clonazePAM 0.5 MG disintegrating tablet Commonly known as:  KLONOPIN Take 1 tablet by mouth daily.   omeprazole 20 MG capsule Commonly known as:  PRILOSEC Take 20 mg by mouth daily.   rosuvastatin 20 MG tablet Commonly known as:  CRESTOR Take 1 tablet (20 mg total) by mouth daily.          Objective:   Physical Exam  Skin:      BP 132/78 (BP Location: Left Arm, Patient Position: Sitting, Cuff Size: Normal)   Pulse 88   Temp 98.1 F (36.7 C) (Oral)  Resp 14   Ht 6\' 2"  (1.88 m)   Wt 251 lb 4 oz (114 kg)   SpO2 97%   BMI 32.26 kg/m  General:   Well developed, well nourished . NAD.  HEENT:  Normocephalic . Face symmetric, atraumatic Lungs:  CTA B Normal respiratory effort, no intercostal retractions, no accessory muscle use. Heart: RRR,  no murmur.  no pretibial edema bilaterally  Abdomen:  Not distended, soft, non-tender. No rebound or rigidity.  No CVA tenderness Skin: Not pale. Not jaundice Neurologic:  alert & oriented X3.  Speech normal, gait appropriate for age and unassisted Psych--  Cognition and judgment appear intact.  Cooperative with normal attention span and concentration.  Behavior appropriate. No anxious or depressed appearing.     Assessment & Plan:   Assessment last seen 2015, reestablish 02/18/2017 GERD Depression, GAD, panic attacks High cholesterol CP : w/u 2014, 2015, cath 2017 @ [facility] , 2017 EGD >> was concluded sx were d/t panik    [facility]AN [facility] 05-2016: TSH 1.1 Labs 10-2016: T chol 143, HDL 35, LDL 81 Labs 02/12/2017: CBC normal, creatinine 0.9, potassium 4.4, ALT 51 slightly elevated, AST 27 normal, alkaline phosphate normal.  UA: Trace protein  Chest pain: Since the last time I saw him in 2015, he continue having CP, eventually had a cardiac catheterization reportedly normal.  At the end, his doctors concluded that chest pain was related with GERD and panic.  Doing okay at this point Anxiety: Has episodes of panic, see above.  Saw psychiatry x1 and was recommend clonazepam. Was also prescribed Lexapro and Trintellix, no s/e but he felt no benefit thus self  d/c them, currently on clonazepam prn.  Last panic attack several weeks ago.  We agreed to continue clonazepam.  Reassess on RTC Proteinuria: Urinalysis showed small amount of protein, kidney function normal, recheck UA and microalbumin.  Further workup if needed although suspect this is a benign finding. GERD: On PPIs as needed High cholesterol: Baseline LDL: 179 (2015), 208 (2016).  He has a FH CAD.  Wonder if he needs to continue with Crestor I recommend him to do so.  Labs on RTC.  RF sent Lipoma: Lump at the left flank likely a lipoma, recommend observation RTC 3 months CPX > 30 min

## 2017-02-18 NOTE — Assessment & Plan Note (Signed)
[facility] 05-[facility]: TSH 1.1 Labs 10-2016: T chol 143, HDL 35, LDL 81 Labs 02/12/2017: CBC normal, creatinine 0.9, potassium 4.4, ALT 51 slightly elevated, AST 27 normal, alkaline phosphate normal.  UA: Trace protein  Chest pain: Since the last time I saw him in 2015, he continue having CP, eventually had a cardiac catheterization reportedly normal.  At the end, his doctors concluded that chest pain was related with GERD and panic.  Doing okay at this point Anxiety: Has episodes of panic, see above.  Saw psychiatry x1 and was recommend clonazepam. Was also prescribed Lexapro and Trintellix, no s/e but he felt no benefit thus self  d/c them, currently on clonazepam prn.  Last panic attack several weeks ago.  We agreed to continue clonazepam.  Reassess on RTC Proteinuria: Urinalysis showed small amount of protein, kidney function normal, recheck UA and microalbumin.  Further workup if needed although suspect this is a benign finding. GERD: On PPIs as needed High cholesterol: Baseline LDL: 179 (2015), 208 (2016).  He has a FH CAD.  Wonder if he needs to continue with Crestor I recommend him to do so.  Labs on RTC.  RF sent Lipoma: Lump at the left flank likely a lipoma, recommend observation RTC 3 months CPX

## 2017-02-18 NOTE — Progress Notes (Signed)
Pre visit review using our clinic review tool, if applicable. No additional management support is needed unless otherwise documented below in the visit note. 

## 2017-04-23 ENCOUNTER — Telehealth: Payer: Self-pay | Admitting: Internal Medicine

## 2017-04-23 NOTE — Telephone Encounter (Signed)
Copied from CRM [id number]. Topic: Quick Communication - See Telephone Encounter >> Apr 23, 2017 11:21 AM Ninfa MeekerPoole, Bridgett H wrote: CRM for notification. See Telephone encounter for:   04/23/17.  Left vm 05/23/17 appt needs to be rescheduled due to office being closed

## 2017-05-23 ENCOUNTER — Ambulatory Visit: Payer: 59 | Admitting: Internal Medicine

## 2017-05-28 ENCOUNTER — Encounter: Payer: Self-pay | Admitting: Internal Medicine

## 2017-05-28 ENCOUNTER — Ambulatory Visit (INDEPENDENT_AMBULATORY_CARE_PROVIDER_SITE_OTHER): Payer: 59 | Admitting: Internal Medicine

## 2017-05-28 VITALS — BP 124/72 | HR 78 | Temp 97.5°F | Resp 16 | Ht 74.0 in | Wt 249.0 lb

## 2017-05-28 DIAGNOSIS — E782 Mixed hyperlipidemia: Secondary | ICD-10-CM

## 2017-05-28 DIAGNOSIS — R739 Hyperglycemia, unspecified: Secondary | ICD-10-CM

## 2017-05-28 MED ORDER — ROSUVASTATIN CALCIUM 20 MG PO TABS: 20.0000 mg | ORAL_TABLET | Freq: Every day | ORAL | 3 refills | Status: DC

## 2017-05-28 NOTE — Patient Instructions (Signed)
GO TO THE LAB : Get the blood work     GO TO THE FRONT DESK Schedule your next appointment for a  Physical exam,fasting, in 1 year

## 2017-05-28 NOTE — Assessment & Plan Note (Signed)
Hyperlipidemia: Continue Crestor, refilled, check a CMP, FLP (had a couple of slices of pepperoni today).  Diet discussed Chest pain: Not an issue lately, not taking PPIs regularly Panic attacks: Not a issue lately RTC 1 year.  CPX.  Call sooner if needed

## 2017-05-28 NOTE — Progress Notes (Signed)
Pre visit review using our clinic review tool, if applicable. No additional management support is needed unless otherwise documented below in the visit note. 

## 2017-05-28 NOTE — Progress Notes (Signed)
Subjective:    Patient ID: Meryl DareMatthew C Mchan, male    DOB: November 21, 1975, 42 y.o.   MRN: [id number]  DOS:  05/28/2017 Type of visit - description : rov Interval history: High cholesterol: Good compliance with Crestor, due for labs Chest pain: No recent symptoms, not taking PPIs Anxiety: Well-controlled, has not needed clonazepam  Has 2 family members diagnosed with sarcoidosis.  Somewhat concerned about it.  Review of Systems Reports relatively healthy diet, he is very active at home but no routine exercise.  Past Medical History:  Diagnosis Date  . Anxiety and depression   . GERD (gastroesophageal reflux disease)   . Hyperglycemia 07/28/2012  . Hyperlipidemia 07/28/2012    Past Surgical History:  Procedure Laterality Date  . NO PAST SURGERIES      Social History   Socioeconomic History  . Marital status: Married    Spouse name: Not on file  . Number of children: 2  . Years of education: Not on file  . Highest education level: Not on file  Occupational History  . Occupation: [employer]sur[employer]   Social Needs  . Financial resource strain: Not on file  . Food insecurity:    Worry: Not on file    Inability: Not on file  . Transportation needs:    Medical: Not on file    Non-medical: Not on file  Tobacco Use  . Smoking status: Current Every Day Smoker  . Smokeless tobacco: Never Used  . Tobacco comment: 1 ppd   Substance and Sexual Activity  . Alcohol use: No  . Drug use: No  . Sexual activity: Not on file  Lifestyle  . Physical activity:    Days per week: Not on file    Minutes per session: Not on file  . Stress: Not on file  Relationships  . Social connections:    Talks on phone: Not on file    Gets together: Not on file    Attends religious service: Not on file    Active member of club or organization: Not on file    Attends meetings of clubs or organizations: Not on file    Relationship status: Not on file  . Intimate partner violence:    Fear of current or ex  partner: Not on file    Emotionally abused: Not on file    Physically abused: Not on file    Forced sexual activity: Not on file  Other Topics Concern  . Not on file  Social History Narrative   [place], father was Hal Neerarl Braaksma a pt of mine deceased ~ 02-2012   Married, 2 children (one from wife)   finished HS, sales       Allergies as of 05/28/2017   No Known Allergies     Medication List        Accurate as of 05/28/17 11:07 AM. Always use your most recent med list.          clonazePAM 0.5 MG disintegrating tablet Commonly known as:  KLONOPIN Take 1 tablet by mouth daily.   omeprazole 20 MG capsule Commonly known as:  PRILOSEC Take 20 mg by mouth daily.   rosuvastatin 20 MG tablet Commonly known as:  CRESTOR Take 1 tablet (20 mg total) by mouth daily.          Objective:   Physical Exam BP 124/72 (BP Location: Left Arm, Patient Position: Sitting, Cuff Size: Normal)   Pulse 78   Temp (!) 97.5 F (36.4 C) (  Oral)   Resp 16   Ht 6\' 2"  (1.88 m)   Wt 249 lb (112.9 kg)   SpO2 97%   BMI 31.97 kg/m  General:   Well developed, well nourished . NAD.  HEENT:  Normocephalic . Face symmetric, atraumatic Lungs:  CTA B Normal respiratory effort, no intercostal retractions, no accessory muscle use. Heart: RRR,  no murmur.  No pretibial edema bilaterally  Skin: Not pale. Not jaundice Neurologic:  alert & oriented X3.  Speech normal, gait appropriate for age and unassisted Psych--  Cognition and judgment appear intact.  Cooperative with normal attention span and concentration.  Behavior appropriate. No anxious or depressed appearing.      Assessment & Plan:  Assessment last seen 2015, reestablish 02/18/2017 GERD Depression, GAD, panic attacks High cholesterol CP : w/u 2014, 2015, cath 2017 @ [facility] , 2017 EGD >> was concluded sx were d/t panik  + FH sarcoidosis  PLAN Hyperlipidemia: Continue Crestor, refilled, check a CMP, FLP (had a couple of  slices of pepperoni today).  Diet discussed Chest pain: Not an issue lately, not taking PPIs regularly Panic attacks: Not a issue lately RTC 1 year.  CPX.  Call sooner if needed

## 2017-05-30 ENCOUNTER — Other Ambulatory Visit: Payer: 59

## 2017-06-02 ENCOUNTER — Other Ambulatory Visit (INDEPENDENT_AMBULATORY_CARE_PROVIDER_SITE_OTHER): Payer: 59

## 2017-06-02 DIAGNOSIS — E782 Mixed hyperlipidemia: Secondary | ICD-10-CM

## 2017-06-02 LAB — LIPID PANEL
CHOL/HDL RATIO: 8
Cholesterol: 265 mg/dL — ABNORMAL HIGH (ref 0–200)
HDL: 31.2 mg/dL — ABNORMAL LOW (ref >39.00)
NonHDL: 233.32
Triglycerides: 214 mg/dL — ABNORMAL HIGH (ref 0.0–149.0)
VLDL: 42.8 mg/dL — AB (ref 0.0–40.0)

## 2017-06-02 LAB — HEMOGLOBIN A1C: HEMOGLOBIN A1C: 5.9 % (ref 4.6–6.5)

## 2017-06-02 LAB — COMPREHENSIVE METABOLIC PANEL
ALK PHOS: 82 U/L (ref 39–117)
ALT: 45 U/L (ref 0–53)
AST: 20 U/L (ref 0–37)
Albumin: 4.2 g/dL (ref 3.5–5.2)
BUN: 13 mg/dL (ref 6–23)
CHLORIDE: 105 meq/L (ref 96–112)
CO2: 29 mEq/L (ref 19–32)
Calcium: 9.4 mg/dL (ref 8.4–10.5)
Creatinine, Ser: 1 mg/dL (ref 0.40–1.50)
GFR: 86.8 mL/min (ref >60.00)
Glucose, Bld: 111 mg/dL — ABNORMAL HIGH (ref 70–99)
POTASSIUM: 4.2 meq/L (ref 3.5–5.1)
SODIUM: 140 meq/L (ref 135–145)
TOTAL PROTEIN: 6.9 g/dL (ref 6.0–8.3)
Total Bilirubin: 0.6 mg/dL (ref 0.2–1.2)

## 2017-06-02 LAB — LDL CHOLESTEROL, DIRECT: Direct LDL: 195 mg/dL

## 2017-06-09 MED ORDER — ROSUVASTATIN CALCIUM 40 MG PO TABS: 40.0000 mg | ORAL_TABLET | Freq: Every day | ORAL | 0 refills | Status: DC

## 2017-06-09 NOTE — Addendum Note (Signed)
Addended byConrad Bliss D on: 06/09/2017 08:46 AM   Modules accepted: Orders

## 2017-08-11 ENCOUNTER — Other Ambulatory Visit (INDEPENDENT_AMBULATORY_CARE_PROVIDER_SITE_OTHER): Payer: 59

## 2017-08-11 DIAGNOSIS — E782 Mixed hyperlipidemia: Secondary | ICD-10-CM

## 2017-08-12 LAB — LIPID PANEL
CHOLESTEROL: 274 mg/dL — AB (ref 0–200)
HDL: 36.4 mg/dL — ABNORMAL LOW (ref >39.00)
NonHDL: 237.3
TRIGLYCERIDES: 255 mg/dL — AB (ref 0.0–149.0)
Total CHOL/HDL Ratio: 8
VLDL: 51 mg/dL — ABNORMAL HIGH (ref 0.0–40.0)

## 2017-08-12 LAB — ALT: ALT: 57 U/L — ABNORMAL HIGH (ref 0–53)

## 2017-08-12 LAB — LDL CHOLESTEROL, DIRECT: Direct LDL: 189 mg/dL

## 2017-08-12 LAB — AST: AST: 29 U/L (ref 0–37)

## 2017-08-15 ENCOUNTER — Telehealth: Payer: Self-pay | Admitting: Internal Medicine

## 2017-08-15 NOTE — Telephone Encounter (Signed)
Patient returned call and message given to him to take his Crestor 40 mg every day. Pt voiced understanding. Appointment also scheduled for 6 months out for follow up. And pt verbalized that he is to be fasting.

## 2017-10-10 ENCOUNTER — Telehealth: Payer: Self-pay | Admitting: Internal Medicine

## 2017-10-10 DIAGNOSIS — E782 Mixed hyperlipidemia: Secondary | ICD-10-CM | POA: Diagnosis not present

## 2017-10-10 MED ORDER — CLONAZEPAM 0.5 MG PO TBDP: 0.5000 mg | ORAL_TABLET | Freq: Every day | ORAL | 1 refills | Status: DC

## 2017-10-10 NOTE — Telephone Encounter (Signed)
Please advise 

## 2017-10-10 NOTE — Telephone Encounter (Signed)
Prescription sent, will reassess  anxiety at the next visit.

## 2017-10-10 NOTE — Telephone Encounter (Signed)
Copied from CRM [id number]. Topic: Quick Communication - Rx Refill/Question >> Oct 10, 2017 11:34 AM Crist Infante wrote: Medication: clonazePAM (KLONOPIN) 0.5 MG tablet 1  a day   90 day clonazePAM (KLONOPIN) 0.25 MG quick disintegrating  tablet  (he takes when he feels one coming on)   Pt states this comes in a box 30   Pt states Dr Drue Novel has never filled this Rx, he had refills from [employer]. But now needs a new Rx.  [id number] - [place], [place] - [address] [phone] (Phone) [phone] (Fax)  Pt has one tablet left of each

## 2017-10-17 ENCOUNTER — Telehealth: Payer: Self-pay | Admitting: *Deleted

## 2017-10-17 NOTE — Telephone Encounter (Signed)
[facility]; forwarded to [place] for email/scan/SLS 09/13

## 2018-02-16 ENCOUNTER — Encounter: Payer: Self-pay | Admitting: Internal Medicine

## 2018-02-16 ENCOUNTER — Ambulatory Visit: Payer: 59 | Admitting: Internal Medicine

## 2018-02-16 VITALS — BP 124/70 | HR 81 | Temp 97.8°F | Resp 16 | Ht 74.0 in | Wt 247.0 lb

## 2018-02-16 DIAGNOSIS — K219 Gastro-esophageal reflux disease without esophagitis: Secondary | ICD-10-CM

## 2018-02-16 DIAGNOSIS — E785 Hyperlipidemia, unspecified: Secondary | ICD-10-CM

## 2018-02-16 DIAGNOSIS — F410 Panic disorder [episodic paroxysmal anxiety] without agoraphobia: Secondary | ICD-10-CM

## 2018-02-16 DIAGNOSIS — L989 Disorder of the skin and subcutaneous tissue, unspecified: Secondary | ICD-10-CM | POA: Diagnosis not present

## 2018-02-16 DIAGNOSIS — F321 Major depressive disorder, single episode, moderate: Secondary | ICD-10-CM

## 2018-02-16 MED ORDER — ESCITALOPRAM OXALATE 10 MG PO TABS
10.0000 mg | ORAL_TABLET | Freq: Every day | ORAL | 1 refills | Status: DC
Start: 2018-02-16 — End: 2018-04-10

## 2018-02-16 MED ORDER — ROSUVASTATIN CALCIUM 40 MG PO TABS: 40.0000 mg | ORAL_TABLET | Freq: Every day | ORAL | 1 refills | Status: DC

## 2018-02-16 MED ORDER — CLONAZEPAM 0.5 MG PO TBDP: 0.5000 mg | ORAL_TABLET | Freq: Every day | ORAL | 3 refills | Status: DC

## 2018-02-16 NOTE — Assessment & Plan Note (Signed)
  GERD: Not taking Prilosec, reason?  Recommend to go back on it.  RF sent Panic attacks: At this point denies ongoing anxiety or depression.  Typically what happens is that he develops some heartburn and then  become panicky ("fell like I am going to die").  Usually clonazepam helps. Seems like heartburn is a trigger for his anxiety thus  restart PPIs Continue with clonazepam SSRIs?  He remembers taking Lexapro once but no other SSRI trials.  Recommend  Lexapro.  Watch for sedation. Skin lesion: Most likely SK, referral to dermatology to confirm and get a check over. High cholesterol: Not taking cholesterol medication regularly, RF sent, RTC 6 weeks fasting Tobacco cessation: Issues discussed, options include Chantix, Wellbutrin, recommend to read about the options, investigate side effects, call me when ready.  He may also benefit from nicotine supplementation RTC 4-6 weeks

## 2018-02-16 NOTE — Progress Notes (Signed)
Pre visit review using our clinic review tool, if applicable. No additional management support is needed unless otherwise documented below in the visit note. 

## 2018-02-16 NOTE — Patient Instructions (Addendum)
  GO TO THE FRONT DESK Schedule your next appointment for a  Fasting appointment in 4-5 weeks  Start lexapro at night  Take the other medicines every day

## 2018-02-16 NOTE — Progress Notes (Signed)
Subjective:    Patient ID: Gregory Gilmore, male    DOB: [birth year], 43 y.o.   MRN: [id number]  DOS:  02/16/2018 Type of visit - description: Routine visit Anxiety: Ongoing issues, on average 1 episode of panic attack weekly, see description below..  Take clonazepam with some success Tobacco: Interested in quitting Cholesterol: Not taking Crestor regularly Skin lesion, for about 2 years, gradually increasing.  No bleeding, itching. GERD: Not taking Prilosec, has on and off epigastric discomfort and heartburn.    Review of Systems  Denies nausea, vomiting, diarrhea. No depression Past Medical History:  Diagnosis Date  . Anxiety and depression   . GERD (gastroesophageal reflux disease)   . Hyperglycemia 07/28/2012  . Hyperlipidemia 07/28/2012    Past Surgical History:  Procedure Laterality Date  . NO PAST SURGERIES      Social History   Socioeconomic History  . Marital status: Married    Spouse name: Not on file  . Number of children: 2  . Years of education: Not on file  . Highest education level: Not on file  Occupational History  . Occupation: [employer]   Social Needs  . Financial resource strain: Not on file  . Food insecurity:    Worry: Not on file    Inability: Not on file  . Transportation needs:    Medical: Not on file    Non-medical: Not on file  Tobacco Use  . Smoking status: Current Every Day Smoker  . Smokeless tobacco: Never Used  . Tobacco comment: 1 ppd   Substance and Sexual Activity  . Alcohol use: No  . Drug use: No  . Sexual activity: Not on file  Lifestyle  . Physical activity:    Days per week: Not on file    Minutes per session: Not on file  . Stress: Not on file  Relationships  . Social connections:    Talks on phone: Not on file    Gets together: Not on file    Attends religious service: Not on file    Active member of club or organization: Not on file    Attends meetings of clubs or organizations: Not on file    Relationship  status: Not on file  . Intimate partner violence:    Fear of current or ex partner: Not on file    Emotionally abused: Not on file    Physically abused: Not on file    Forced sexual activity: Not on file  Other Topics Concern  . Not on file  Social History Narrative   [place] from [place], father was Hal Neerarl Smart a pt of mine deceased ~ 02-2012   Married, 2 children (one from wife)   finished HS, sales       Allergies as of 02/16/2018   No Known Allergies     Medication List       Accurate as of February 16, 2018  8:04 AM. Always use your most recent med list.        clonazePAM 0.5 MG disintegrating tablet Commonly known as:  KLONOPIN Take 1 tablet (0.5 mg total) by mouth daily.   omeprazole 20 MG capsule Commonly known as:  PRILOSEC Take 20 mg by mouth daily.   rosuvastatin 40 MG tablet Commonly known as:  CRESTOR Take 1 tablet (40 mg total) by mouth at bedtime.           Objective:   Physical Exam BP 124/70 (BP Location: Left Arm, Patient Position: Sitting, Cuff  Size: Normal)   Pulse 81   Temp 97.8 F (36.6 C) (Oral)   Resp 16   Ht 6\' 2"  (1.88 m)   Wt 247 lb (112 kg)   SpO2 98%   BMI 31.71 kg/m  General:   Well developed, NAD, BMI noted. HEENT:  Normocephalic . Face symmetric, atraumatic Skin: Has a 4 x 5 mm dark, rough skin lesion at the right temple. Neurologic:  alert & oriented X3.  Speech normal, gait appropriate for age and unassisted Psych--  Cognition and judgment appear intact.  Cooperative with normal attention span and concentration.  Behavior appropriate. No anxious or depressed appearing.      Assessment     Assessment last seen 2015, reestablish 02/18/2017 GERD Depression, GAD, panic attacks High cholesterol CP : w/u 2014, 2015, cath 2017 @ [facility] , 2017 EGD >> was concluded sx were d/t panik  + FH sarcoidosis  PLAN GERD: Not taking Prilosec, reason?  Recommend to go back on it.  RF sent Panic attacks: At this point denies  ongoing anxiety or depression.  Typically what happens is that he develops some heartburn and then  become panicky ("fell like I am going to die").  Usually clonazepam helps. Seems like heartburn is a trigger for his anxiety thus  restart PPIs Continue with clonazepam SSRIs?  He remembers taking Lexapro once but no other SSRI trials.  Recommend  Lexapro.  Watch for sedation. Skin lesion: Most likely SK, referral to dermatology to confirm and get a check over. High cholesterol: Not taking cholesterol medication regularly, RF sent, RTC 6 weeks fasting Tobacco cessation: Issues discussed, options include Chantix, Wellbutrin, recommend to read about the options, investigate side effects, call me when ready.  He may also benefit from nicotine supplementation RTC 4-6 weeks  Today, I spent more than 25   min with the patient: >50% of the time counseling regards management of panic attacks, counseling about tobacco cessation options

## 2018-03-27 ENCOUNTER — Ambulatory Visit: Payer: 59 | Admitting: Internal Medicine

## 2018-04-10 ENCOUNTER — Encounter: Payer: Self-pay | Admitting: Internal Medicine

## 2018-04-10 ENCOUNTER — Ambulatory Visit: Payer: 59 | Admitting: Internal Medicine

## 2018-04-10 VITALS — BP 128/62 | HR 69 | Temp 98.2°F | Resp 16 | Ht 74.0 in | Wt 248.1 lb

## 2018-04-10 DIAGNOSIS — L989 Disorder of the skin and subcutaneous tissue, unspecified: Secondary | ICD-10-CM | POA: Diagnosis not present

## 2018-04-10 DIAGNOSIS — K219 Gastro-esophageal reflux disease without esophagitis: Secondary | ICD-10-CM | POA: Diagnosis not present

## 2018-04-10 DIAGNOSIS — F410 Panic disorder [episodic paroxysmal anxiety] without agoraphobia: Secondary | ICD-10-CM | POA: Diagnosis not present

## 2018-04-10 DIAGNOSIS — E785 Hyperlipidemia, unspecified: Secondary | ICD-10-CM | POA: Diagnosis not present

## 2018-04-10 LAB — LIPID PANEL
CHOL/HDL RATIO: 8
CHOLESTEROL: 272 mg/dL — AB (ref 0–200)
HDL: 33.5 mg/dL — ABNORMAL LOW (ref >39.00)
NONHDL: 238.73
TRIGLYCERIDES: 217 mg/dL — AB (ref 0.0–149.0)
VLDL: 43.4 mg/dL — ABNORMAL HIGH (ref 0.0–40.0)

## 2018-04-10 LAB — HEPATIC FUNCTION PANEL
ALBUMIN: 4.6 g/dL (ref 3.5–5.2)
ALK PHOS: 86 U/L (ref 39–117)
ALT: 52 U/L (ref 0–53)
AST: 23 U/L (ref 0–37)
Bilirubin, Direct: 0.1 mg/dL (ref 0.0–0.3)
Total Bilirubin: 0.9 mg/dL (ref 0.2–1.2)
Total Protein: 6.9 g/dL (ref 6.0–8.3)

## 2018-04-10 LAB — LDL CHOLESTEROL, DIRECT: Direct LDL: 204 mg/dL

## 2018-04-10 MED ORDER — SERTRALINE HCL 50 MG PO TABS: ORAL_TABLET | ORAL | 1 refills | Status: DC

## 2018-04-10 NOTE — Progress Notes (Signed)
Pre visit review using our clinic review tool, if applicable. No additional management support is needed unless otherwise documented below in the visit note. 

## 2018-04-10 NOTE — Patient Instructions (Signed)
GO TO THE LAB : Get the blood work     GO TO THE FRONT DESK Schedule your next appointment  For a check up in 3 months   Start Zoloft or sertraline 50 mg Half tablet at night for 1 week Then 1 tablet at night for 1 week Take 1.5 tablets at night.  Okay to take clonazepam at bedtime to help you sleep or if you have a panic attack.  Call if side effects  HEALTHY SLEEP Sleep hygiene: Basic rules for a good night's sleep  Sleep only as much as you need to feel rested and then get out of bed  Keep a regular sleep schedule  Avoid forcing sleep  Exercise regularly for at least 20 minutes, preferably 4 to 5 hours before bedtime  Avoid caffeinated beverages after lunch  Avoid alcohol near bedtime: no "night cap"  Avoid smoking, especially in the evening  Do not go to bed hungry  Adjust bedroom environment  Avoid prolonged use of light-emitting screens before bedtime   Deal with your worries before bedtime

## 2018-04-10 NOTE — Progress Notes (Signed)
Subjective:    Patient ID: Gregory Gilmore, male    DOB: 28-Mar-1975, 43 y.o.   MRN: [id number]  DOS:  04/10/2018 Type of visit - description: Follow-up See previous visit. GERD: PPI is working, no further symptoms Panic attacks: Was intolerant to Lexapro due to ED.  Needs an alternative, still having panic attacks, taking clonazepam at night but afraid to take clonazepam during the daytime. Also reports insomnia, wakes up to 3 times at night. High cholesterol: Due for labs    Review of Systems Denies a snoring, fatigue or falling asleep inappropriately.  Past Medical History:  Diagnosis Date  . Anxiety and depression   . GERD (gastroesophageal reflux disease)   . Hyperglycemia 07/28/2012  . Hyperlipidemia 07/28/2012    Past Surgical History:  Procedure Laterality Date  . NO PAST SURGERIES      Social History   Socioeconomic History  . Marital status: Married    Spouse name: Not on file  . Number of children: 2  . Years of education: Not on file  . Highest education level: Not on file  Occupational History  . Occupation: Neurosurgeon  . Financial resource strain: Not on file  . Food insecurity:    Worry: Not on file    Inability: Not on file  . Transportation needs:    Medical: Not on file    Non-medical: Not on file  Tobacco Use  . Smoking status: Current Every Day Smoker  . Smokeless tobacco: Never Used  . Tobacco comment: 1 ppd   Substance and Sexual Activity  . Alcohol use: No  . Drug use: No  . Sexual activity: Not on file  Lifestyle  . Physical activity:    Days per week: Not on file    Minutes per session: Not on file  . Stress: Not on file  Relationships  . Social connections:    Talks on phone: Not on file    Gets together: Not on file    Attends religious service: Not on file    Active member of club or organization: Not on file    Attends meetings of clubs or organizations: Not on file    Relationship status: Not on file  .  Intimate partner violence:    Fear of current or ex partner: Not on file    Emotionally abused: Not on file    Physically abused: Not on file    Forced sexual activity: Not on file  Other Topics Concern  . Not on file  Social History Narrative   Original from [place], father was Dashan Kinlock a pt of mine deceased ~ March 09, 2012   Married, 2 children (one from wife)   finished HS, sales       Allergies as of 04/10/2018   No Known Allergies     Medication List       Accurate as of April 10, 2018  9:17 AM. Always use your most recent med list.        clonazePAM 0.5 MG disintegrating tablet Commonly known as:  KLONOPIN Take 1 tablet (0.5 mg total) by mouth daily.   escitalopram 10 MG tablet Commonly known as:  LEXAPRO Take 1 tablet (10 mg total) by mouth daily.   omeprazole 20 MG capsule Commonly known as:  PRILOSEC Take 20 mg by mouth daily.   rosuvastatin 40 MG tablet Commonly known as:  Crestor Take 1 tablet (40 mg total) by mouth at bedtime.  Objective:   Physical Exam BP 128/62 (BP Location: Left Arm, Patient Position: Sitting, Cuff Size: Normal)   Pulse 69   Temp 98.2 F (36.8 C) (Oral)   Resp 16   Ht 6\' 2"  (1.88 m)   Wt 248 lb 2 oz (112.5 kg)   SpO2 96%   BMI 31.86 kg/m  General:   Well developed, NAD, BMI noted. HEENT:  Normocephalic . Face symmetric, atraumatic Neurologic:  alert & oriented X3.  Speech normal, gait appropriate for age and unassisted Psych--  Cognition and judgment appear intact.  Cooperative with normal attention span and concentration.  Behavior appropriate. No anxious or depressed appearing.      Assessment    Assessment last seen 2015, reestablish 02/18/2017 GERD Depression, GAD, panic attacks High cholesterol CP : w/u 2014, 2015, cath 2017 @ [facility] , 2017 EGD >> was concluded sx were d/t panik  + FH sarcoidosis  PLAN GERD: On PPIs, controlled. Panic attacks: See last visit, I felt that they were triggered by  acid reflux but that is not the case.  He continue with panic attacks. Self stopped Lexapro due to erectile dysfunction. Panic episodes are controlled with clonazepam but he only take it at night, is afraid he is going to get sleepy during the daytime. Previously Trintellix did not work for him. Plan: Start sertraline, gradually.  He knows this will be trial and error process until we find a medication that will help without s/e.  Insomnia: Okay to take clonazepam every night to promote healthy sleep, tips provided. High cholesterol: On Crestor, checking labs Skin lesion: Did not get to see dermatology new referral sent. RTC 3 months.

## 2018-04-10 NOTE — Assessment & Plan Note (Signed)
GERD: On PPIs, controlled. Panic attacks: See last visit, I felt that they were triggered by acid reflux but that is not the case.  He continue with panic attacks. Self stopped Lexapro due to erectile dysfunction. Panic episodes are controlled with clonazepam but he only take it at night, is afraid he is going to get sleepy during the daytime. Previously Trintellix did not work for him. Plan: Start sertraline, gradually.  He knows this will be trial and error process until we find a medication that will help without s/e.  Insomnia: Okay to take clonazepam every night to promote healthy sleep, tips provided. High cholesterol: On Crestor, checking labs Skin lesion: Did not get to see dermatology new referral sent. RTC 3 months.

## 2018-04-13 MED ORDER — EZETIMIBE 10 MG PO TABS: 10.0000 mg | ORAL_TABLET | Freq: Every day | ORAL | 12 refills | Status: DC

## 2018-04-13 NOTE — Addendum Note (Signed)
Addended byConrad Ridgeville D on: 04/13/2018 03:21 PM   Modules accepted: Orders

## 2018-06-02 ENCOUNTER — Encounter: Payer: 59 | Admitting: Internal Medicine

## 2018-07-08 ENCOUNTER — Telehealth: Payer: Self-pay

## 2018-07-08 NOTE — Telephone Encounter (Signed)
Left VM for patient to switch appointment to virtual.

## 2018-07-10 ENCOUNTER — Ambulatory Visit (INDEPENDENT_AMBULATORY_CARE_PROVIDER_SITE_OTHER): Payer: 59 | Admitting: Internal Medicine

## 2018-07-10 ENCOUNTER — Other Ambulatory Visit: Payer: Self-pay

## 2018-07-10 DIAGNOSIS — F410 Panic disorder [episodic paroxysmal anxiety] without agoraphobia: Secondary | ICD-10-CM

## 2018-07-10 DIAGNOSIS — G4700 Insomnia, unspecified: Secondary | ICD-10-CM | POA: Diagnosis not present

## 2018-07-10 DIAGNOSIS — F411 Generalized anxiety disorder: Secondary | ICD-10-CM | POA: Diagnosis not present

## 2018-07-10 MED ORDER — ZOLPIDEM TARTRATE 10 MG PO TABS: 5.0000 mg | ORAL_TABLET | Freq: Every evening | ORAL | 0 refills | Status: DC | PRN

## 2018-07-10 MED ORDER — BUSPIRONE HCL 10 MG PO TABS: 10.0000 mg | ORAL_TABLET | Freq: Two times a day (BID) | ORAL | 1 refills | Status: DC

## 2018-07-10 MED ORDER — CLONAZEPAM 0.5 MG PO TBDP: 0.5000 mg | ORAL_TABLET | Freq: Every day | ORAL | 0 refills | Status: DC

## 2018-07-10 NOTE — Progress Notes (Signed)
Subjective:    Patient ID: Gregory Gilmore, male    DOB: 02/19/1975, 43 y.o.   MRN: [id number]  DOS:  07/10/2018 Type of visit - description: Virtual Visit via Video Note  I connected with@ on 07/10/18 at 10:20 AM EDT by a video enabled telemedicine application and verified that I am speaking with the correct person using two identifiers.   THIS ENCOUNTER IS A VIRTUAL VISIT DUE TO COVID-19 - PATIENT WAS NOT SEEN IN THE OFFICE. PATIENT HAS CONSENTED TO VIRTUAL VISIT / TELEMEDICINE VISIT   Location of patient: home  Location of provider: office  I discussed the limitations of evaluation and management by telemedicine and the availability of in person appointments. The patient expressed understanding and agreed to proceed.  History of Present Illness:  Follow-up from previous visit For anxiety was prescribed sertraline, took it for 2 weeks, self discontinued due to erectile dysfunction and decreased libido. Still has panic attacks that responded relatively well to clonazepam Still has insomnia, able to sleep ~ 4 hours at night although sometimes it is even less.  Review of Systems COVID-19: Taking very good precautions, no fever chills.  No chest pain no cough.  Past Medical History:  Diagnosis Date  . Anxiety and depression   . GERD (gastroesophageal reflux disease)   . Hyperglycemia 07/28/2012  . Hyperlipidemia 07/28/2012    Past Surgical History:  Procedure Laterality Date  . NO PAST SURGERIES      Social History   Socioeconomic History  . Marital status: Married    Spouse name: Not on file  . Number of children: 2  . Years of education: Not on file  . Highest education level: Not on file  Occupational History  . Occupation: Neurosurgeon  . Financial resource strain: Not on file  . Food insecurity:    Worry: Not on file    Inability: Not on file  . Transportation needs:    Medical: Not on file    Non-medical: Not on file  Tobacco Use  . Smoking  status: Current Every Day Smoker  . Smokeless tobacco: Never Used  . Tobacco comment: 1 ppd   Substance and Sexual Activity  . Alcohol use: No  . Drug use: No  . Sexual activity: Not on file  Lifestyle  . Physical activity:    Days per week: Not on file    Minutes per session: Not on file  . Stress: Not on file  Relationships  . Social connections:    Talks on phone: Not on file    Gets together: Not on file    Attends religious service: Not on file    Active member of club or organization: Not on file    Attends meetings of clubs or organizations: Not on file    Relationship status: Not on file  . Intimate partner violence:    Fear of current or ex partner: Not on file    Emotionally abused: Not on file    Physically abused: Not on file    Forced sexual activity: Not on file  Other Topics Concern  . Not on file  Social History Narrative   Original from [place], father was Geoffry Pickell a pt of mine deceased ~ 2012-03-08   Married, 2 children (one from wife)   finished HS, sales       Allergies as of 07/10/2018   No Known Allergies     Medication List  Accurate as of July 10, 2018 10:14 AM. If you have any questions, ask your nurse or doctor.        clonazePAM 0.5 MG disintegrating tablet Commonly known as:  KLONOPIN Take 1 tablet (0.5 mg total) by mouth daily.   ezetimibe 10 MG tablet Commonly known as:  Zetia Take 1 tablet (10 mg total) by mouth at bedtime.   omeprazole 20 MG capsule Commonly known as:  PRILOSEC Take 20 mg by mouth daily.   rosuvastatin 40 MG tablet Commonly known as:  Crestor Take 1 tablet (40 mg total) by mouth at bedtime.   sertraline 50 MG tablet Commonly known as:  ZOLOFT Half tablet at night for 1 week Then 1 tablet at night for 1 week Then take 1.5 tablets at night.           Objective:   Physical Exam There were no vitals taken for this visit. This is a virtual video visit.  He is alert oriented x3.  Today he seems be  better emotionally, more calm/relax    Assessment     Assessment last seen 2015, reestablish 02/18/2017 GERD Depression, GAD, panic attacks (intolerant to Trintellix, Lexapro and sertraline caused erectile dysfunction) High cholesterol CP : w/u 2014, 2015, cath 2017 @ [facility] , 2017 EGD >> was concluded sx were d/t panik  + FH sarcoidosis  PLAN Panic attacks: Intolerant to sertraline due to decreased libido and erectile dysfunction.  Still has good response to clonazepam as needed.  He would like to try another preventive medication. Plan: BuSpar 10 mg twice daily, refill clonazepam He is aware we are starting a low-dose of BuSpar, will call in 2 to 3 weeks if is not helping and will consider increase the dosage gradually. Consider psychiatry referral Insomnia: Although is reluctant to take medications he states needs something to help.  Start Ambien. High cholesterol: Refill meds, due for labs RTC in person in 6 weeks, fasting.    I discussed the assessment and treatment plan with the patient. The patient was provided an opportunity to ask questions and all were answered. The patient agreed with the plan and demonstrated an understanding of the instructions.   The patient was advised to call back or seek an in-person evaluation if the symptoms worsen or if the condition fails to improve as anticipated.

## 2018-07-10 NOTE — Patient Instructions (Signed)
Was nice to see you today   We will start buspirone 10 mg twice a day to help prevent panic attacks. This is a low-dose, if you feel that you need a higher dose please let me know in a couple of weeks.   Continue using clonazepam if you develop a panic attack   Start taking Ambien (zolpidem) either half or 1 tablet before bedtime to help you sleep.   I like to see you in person here at the office in about 6 to 8 weeks. At that time I am also planning to do blood work to recheck your cholesterol.   Please call if you have questions.

## 2018-07-11 DIAGNOSIS — G4700 Insomnia, unspecified: Secondary | ICD-10-CM | POA: Insufficient documentation

## 2018-07-11 NOTE — Assessment & Plan Note (Signed)
Panic attacks: Intolerant to sertraline due to decreased libido and erectile dysfunction.  Still has good response to clonazepam as needed.  He would like to try another preventive medication. Plan: BuSpar 10 mg twice daily, refill clonazepam He is aware we are starting a low-dose of BuSpar, will call in 2 to 3 weeks if is not helping and will consider increase the dosage gradually. Consider psychiatry referral Insomnia: Although is reluctant to take medications he states needs something to help.  Start Ambien. High cholesterol: Refill meds, due for labs RTC in person in 6 weeks, fasting.

## 2018-08-12 ENCOUNTER — Telehealth: Payer: Self-pay | Admitting: Internal Medicine

## 2018-08-12 DIAGNOSIS — F410 Panic disorder [episodic paroxysmal anxiety] without agoraphobia: Secondary | ICD-10-CM

## 2018-08-12 DIAGNOSIS — Z79899 Other long term (current) drug therapy: Secondary | ICD-10-CM

## 2018-08-12 DIAGNOSIS — G4700 Insomnia, unspecified: Secondary | ICD-10-CM

## 2018-08-12 DIAGNOSIS — E785 Hyperlipidemia, unspecified: Secondary | ICD-10-CM

## 2018-08-12 NOTE — Telephone Encounter (Signed)
Patient is due for FLP, please arrange

## 2018-08-13 ENCOUNTER — Telehealth: Payer: Self-pay | Admitting: Internal Medicine

## 2018-08-13 NOTE — Telephone Encounter (Signed)
Letter mailed to Pt informing that he is due for FLP- instructed to call office to schedule lab appt at his convenience. Pt also due for UDS- ordered.

## 2018-08-13 NOTE — Telephone Encounter (Signed)
Needs virtual visit please.  

## 2018-08-13 NOTE — Telephone Encounter (Signed)
Relation to pt: self  Call back number: [phone]   Reason for call:  Patient was informed today that his daughter was positive for COVID, patient currently not experiencing any symptoms and would like to be tested, please advise today.

## 2018-08-13 NOTE — Telephone Encounter (Signed)
Scheduled virtual visit for Friday at 2:20

## 2018-08-14 ENCOUNTER — Other Ambulatory Visit: Payer: Self-pay

## 2018-08-14 ENCOUNTER — Ambulatory Visit (INDEPENDENT_AMBULATORY_CARE_PROVIDER_SITE_OTHER): Payer: 59 | Admitting: Internal Medicine

## 2018-08-14 DIAGNOSIS — F410 Panic disorder [episodic paroxysmal anxiety] without agoraphobia: Secondary | ICD-10-CM | POA: Diagnosis not present

## 2018-08-14 DIAGNOSIS — E785 Hyperlipidemia, unspecified: Secondary | ICD-10-CM | POA: Diagnosis not present

## 2018-08-14 NOTE — Progress Notes (Signed)
Subjective:    Patient ID: Gregory DareMatthew C Gilmore, male    DOB: [birth year], 43 y.o.   MRN: [id number]  DOS:  08/14/2018 Type of visit - description: Virtual Visit via Video Note  I connected with@ on 08/14/18 at  2:20 PM EDT by a video enabled telemedicine application and verified that I am speaking with the correct person using two identifiers.   THIS ENCOUNTER IS A VIRTUAL VISIT DUE TO COVID-19 - PATIENT WAS NOT SEEN IN THE OFFICE. PATIENT HAS CONSENTED TO VIRTUAL VISIT / TELEMEDICINE VISIT   Location of patient: home  Location of provider: office  I discussed the limitations of evaluation and management by telemedicine and the availability of in person appointments. The patient expressed understanding and agreed to proceed.  History of Present Illness: Follow-up Anxiety: On BuSpar, doing well. His daughter tested positive for COVID-19, the last time they spend time together was July 4. Gregory Gilmore himself is asymptomatic High cholesterol: Due for labs   Review of Systems Denies fever chills No chest pain no difficulty breathing No nausea, vomiting, diarrhea No cough No problems with smell or taste  Past Medical History:  Diagnosis Date  . Anxiety and depression   . GERD (gastroesophageal reflux disease)   . Hyperglycemia 07/28/2012  . Hyperlipidemia 07/28/2012    Past Surgical History:  Procedure Laterality Date  . NO PAST SURGERIES      Social History   Socioeconomic History  . Marital status: Married    Spouse name: Not on file  . Number of children: 2  . Years of education: Not on file  . Highest education level: Not on file  Occupational History  . Occupation: Neurosurgeonsales   Social Needs  . Financial resource strain: Not on file  . Food insecurity    Worry: Not on file    Inability: Not on file  . Transportation needs    Medical: Not on file    Non-medical: Not on file  Tobacco Use  . Smoking status: Current Every Day Smoker  . Smokeless tobacco: Never Used  .  Tobacco comment: 1 ppd   Substance and Sexual Activity  . Alcohol use: No  . Drug use: No  . Sexual activity: Not on file  Lifestyle  . Physical activity    Days per week: Not on file    Minutes per session: Not on file  . Stress: Not on file  Relationships  . Social Musicianconnections    Talks on phone: Not on file    Gets together: Not on file    Attends religious service: Not on file    Active member of club or organization: Not on file    Attends meetings of clubs or organizations: Not on file    Relationship status: Not on file  . Intimate partner violence    Fear of current or ex partner: Not on file    Emotionally abused: Not on file    Physically abused: Not on file    Forced sexual activity: Not on file  Other Topics Concern  . Not on file  Social History Narrative   [place], father was Hal Neerarl Stuckey a pt of mine deceased ~ 02-2012   Married, 2 children (one from wife)   finished HS, sales       Allergies as of 08/14/2018   No Known Allergies     Medication List       Accurate as of August 14, 2018  2:41 PM. If  you have any questions, ask your nurse or doctor.        busPIRone 10 MG tablet Commonly known as: BUSPAR Take 1 tablet (10 mg total) by mouth 2 (two) times daily.   clonazePAM 0.5 MG disintegrating tablet Commonly known as: KLONOPIN Take 1 tablet (0.5 mg total) by mouth daily.   ezetimibe 10 MG tablet Commonly known as: Zetia Take 1 tablet (10 mg total) by mouth at bedtime.   omeprazole 20 MG capsule Commonly known as: PRILOSEC Take 20 mg by mouth daily.   rosuvastatin 40 MG tablet Commonly known as: Crestor Take 1 tablet (40 mg total) by mouth at bedtime.   zolpidem 10 MG tablet Commonly known as: AMBIEN Take 0.5-1 tablets (5-10 mg total) by mouth at bedtime as needed for up to 30 days for sleep.           Objective:   Physical Exam There were no vitals taken for this visit. This is a virtual video visit, alert oriented  x3, he seems to be doing well.  No anxiety or depression noted    Assessment       Assessment last seen 2015, reestablish 02/18/2017 GERD Depression, GAD, panic attacks (intolerant to Trintellix, Lexapro and sertraline caused erectile dysfunction) High cholesterol CP : w/u 2014, 2015, cath 2017 @ [facility] , 2017 EGD >> was concluded sx were d/t panik  + FH sarcoidosis  PLAN Panic attacks: Since the last office visit, started BuSpar, feels better, does not desire to increase the dose at this time.  Will call for refills when needed. High cholesterol: On rosuvastatin, we added Zetia based on the last labs, will schedule a FLP COVID-19 exposure: As described above, he is asymptomatic, he got tested yesterday at another facility, results will be ready in few days. My recommendation is to be even more strict with quarantine even within his house, he lives with a wife and children.  To use a mask where they are in the same room.  If he develops symptoms and they are mild, treat at home, if they are severe go to the ER. He plans to check the rest of his family if he himself is positive for cough RTC 4 to 6 months CPX   I discussed the assessment and treatment plan with the patient. The patient was provided an opportunity to ask questions and all were answered. The patient agreed with the plan and demonstrated an understanding of the instructions.   The patient was advised to call back or seek an in-person evaluation if the symptoms worsen or if the condition fails to improve as anticipated.

## 2018-08-16 NOTE — Assessment & Plan Note (Signed)
Panic attacks: Since the last office visit, started BuSpar, feels better, does not desire to increase the dose at this time.  Will call for refills when needed. High cholesterol: On rosuvastatin, we added Zetia based on the last labs, will schedule a FLP COVID-19 exposure: As described above, he is asymptomatic, he got tested yesterday at another facility, results will be ready in few days. My recommendation is to be even more strict with quarantine even within his house, he lives with a wife and children.  To use a mask where they are in the same room.  If he develops symptoms and they are mild, treat at home, if they are severe go to the ER. He plans to check the rest of his family if he himself is positive for cough RTC 4 to 6 months CPX

## 2018-11-03 ENCOUNTER — Ambulatory Visit: Payer: 59 | Admitting: Internal Medicine

## 2018-11-03 ENCOUNTER — Ambulatory Visit: Payer: Self-pay | Admitting: *Deleted

## 2018-11-03 ENCOUNTER — Encounter: Payer: Self-pay | Admitting: Internal Medicine

## 2018-11-03 ENCOUNTER — Telehealth: Payer: Self-pay | Admitting: Internal Medicine

## 2018-11-03 ENCOUNTER — Ambulatory Visit (HOSPITAL_BASED_OUTPATIENT_CLINIC_OR_DEPARTMENT_OTHER)
Admission: RE | Admit: 2018-11-03 | Discharge: 2018-11-03 | Disposition: A | Discharge status: Home / Self Care | Payer: 59 | Source: Ambulatory Visit | Attending: Internal Medicine | Admitting: Internal Medicine

## 2018-11-03 ENCOUNTER — Other Ambulatory Visit: Payer: Self-pay

## 2018-11-03 VITALS — BP 106/70 | HR 87 | Temp 97.1°F | Resp 16 | Ht 74.0 in | Wt 227.2 lb

## 2018-11-03 DIAGNOSIS — E785 Hyperlipidemia, unspecified: Secondary | ICD-10-CM

## 2018-11-03 DIAGNOSIS — F410 Panic disorder [episodic paroxysmal anxiety] without agoraphobia: Secondary | ICD-10-CM

## 2018-11-03 DIAGNOSIS — G4700 Insomnia, unspecified: Secondary | ICD-10-CM

## 2018-11-03 DIAGNOSIS — R079 Chest pain, unspecified: Secondary | ICD-10-CM | POA: Insufficient documentation

## 2018-11-03 DIAGNOSIS — R634 Abnormal weight loss: Secondary | ICD-10-CM | POA: Diagnosis not present

## 2018-11-03 DIAGNOSIS — Z79899 Other long term (current) drug therapy: Secondary | ICD-10-CM

## 2018-11-03 MED ORDER — CLONAZEPAM 0.5 MG PO TBDP: 0.5000 mg | ORAL_TABLET | Freq: Every day | ORAL | 2 refills | Status: DC

## 2018-11-03 NOTE — Telephone Encounter (Signed)
Clonazepam refill.   Last OV: 08/14/2018 Last Fill: 07/10/2018 #30 and 0RF Pt sig: 1 tab daily UDS: None

## 2018-11-03 NOTE — Progress Notes (Signed)
Subjective:    Patient ID: Gregory Gilmore, male    DOB: [birth year], 43 y.o.   MRN: [id number]  DOS:  11/03/2018 Type of visit - description: Acute, here with his wife He has a long history of chest pain for actually years. Pain has been gradually increasing over the last months and particularly over the last week. It is located anteriorly, on and off, there is a spot at the left upper chest that is actually tender to palpation. Symptoms decreased somewhat with taking Klonopin. Occasionally he has some left arm numbness.  Weight loss: No doing exercise however he has tried to change his diet.  Stress much increased lately.   Wt Readings from Last 3 Encounters:  11/03/18 227 lb 4 oz (103.1 kg)  04/10/18 248 lb 2 oz (112.5 kg)  02/16/18 247 lb (112 kg)    Review of Systems He drove back from [place] 2 days ago, he denies any calf swelling or lower extremity edema. No cough, no shortness of breath. + DOE when he runs up stairs, not a new symptom. He continue smoking.   Past Medical History:  Diagnosis Date   Anxiety and depression    GERD (gastroesophageal reflux disease)    Hyperglycemia 07/28/2012   Hyperlipidemia 07/28/2012    Past Surgical History:  Procedure Laterality Date   NO PAST SURGERIES      Social History   Socioeconomic History   Marital status: Married    Spouse name: Not on file   Number of children: 2   Years of education: Not on file   Highest education level: Not on file  Occupational History   Occupation: Community education officer strain: Not on file   Food insecurity    Worry: Not on file    Inability: Not on file   Transportation needs    Medical: Not on file    Non-medical: Not on file  Tobacco Use   Smoking status: Current Every Day Smoker   Smokeless tobacco: Never Used   Tobacco comment: 1 ppd   Substance and Sexual Activity   Alcohol use: No   Drug use: No   Sexual activity: Not on  file  Lifestyle   Physical activity    Days per week: Not on file    Minutes per session: Not on file   Stress: Not on file  Relationships   Social connections    Talks on phone: Not on file    Gets together: Not on file    Attends religious service: Not on file    Active member of club or organization: Not on file    Attends meetings of clubs or organizations: Not on file    Relationship status: Not on file   Intimate partner violence    Fear of current or ex partner: Not on file    Emotionally abused: Not on file    Physically abused: Not on file    Forced sexual activity: Not on file  Other Topics Concern   Not on file  Social History Narrative   Original from [place], father was Gregory Gilmore a pt of mine deceased ~ 03-02-2012   Married, 2 children (one from wife)   finished HS, sales       Allergies as of 11/03/2018   No Known Allergies     Medication List       Accurate as of November 03, 2018 11:59 PM. If you have any  questions, ask your nurse or doctor.        busPIRone 10 MG tablet Commonly known as: BUSPAR Take 1 tablet (10 mg total) by mouth 2 (two) times daily.   clonazePAM 0.5 MG disintegrating tablet Commonly known as: KLONOPIN Take 1 tablet (0.5 mg total) by mouth daily.   ezetimibe 10 MG tablet Commonly known as: Zetia Take 1 tablet (10 mg total) by mouth at bedtime.   omeprazole 20 MG capsule Commonly known as: PRILOSEC Take 20 mg by mouth daily.   rosuvastatin 40 MG tablet Commonly known as: Crestor Take 1 tablet (40 mg total) by mouth at bedtime.   zolpidem 10 MG tablet Commonly known as: AMBIEN Take 0.5-1 tablets (5-10 mg total) by mouth at bedtime as needed for up to 30 days for sleep.           Objective:   Physical Exam Chest:      BP 106/70 (BP Location: Left Arm, Patient Position: Sitting, Cuff Size: Normal)    Pulse 87    Temp (!) 97.1 F (36.2 C) (Temporal)    Resp 16    Ht 6\' 2"  (1.88 m)    Wt 227 lb 4 oz (103.1  kg)    SpO2 97%    BMI 29.18 kg/m  General:   Well developed, NAD, BMI noted.  HEENT:  Normocephalic . Face symmetric, atraumatic Lungs:  CTA B Normal respiratory effort, no intercostal retractions, no accessory muscle use. Heart: RRR,  no murmur.  no pretibial edema bilaterally.  Calves symmetric and not tender Abdomen:  Not distended, soft, non-tender. No rebound or rigidity.   Skin: Not pale. Not jaundice Neurologic:  alert & oriented X3.  Speech normal, gait appropriate for age and unassisted Psych--  Cognition and judgment appear intact.  Cooperative with normal attention span and concentration.  Behavior appropriate. No anxious or depressed appearing.     Assessment       Assessment last seen 2015, reestablish 02/18/2017 GERD Depression, GAD, panic attacks (intolerant to Trintellix, Lexapro and sertraline caused erectile dysfunction) High cholesterol CP : w/u 2014, 2015, cath 2017 @ [facility] , 2017 EGD >> was concluded sx were d/t panik  CT angio 2015: (-) + FH Sarcoidosis  PLAN Chest pain: Chronic, getting worse lately.  The patient was already experiencing increased pain before he went to [place] thus I doubt pain related to a PE. Panic is likely related anxiety and/or MSK. EKG: NSR, no acute Plan: Get a chest x-ray, sed rate.  Treat anxiety (he ran out of clonazepam).  Call anytime if symptoms are not improving.  Consider further eval.  CT angio?. Depression, GAD, panic attacks.  Currently on BuSpar, does not take it every day, recommend to do so.  Refill clonazepam. High cholesterol: On Crestor, based on last FLP Zetia was added, checking labs. Weight loss: Significant weight loss, he did say that he is trying to eat a little healthier but did not expect such dramatic weight loss.  Will do CBC, BMP TSH. RTC this week for labs and 4 weeks for CPX

## 2018-11-03 NOTE — Telephone Encounter (Signed)
Requested medication (s) are due for refill today:  yes  Requested medication (s) are on the active medication list:  yes  Future visit scheduled:  Yes  Last Refill: 07/10/18; #30; no refills  Requested Prescriptions  Pending Prescriptions Disp Refills   clonazePAM (KLONOPIN) 0.5 MG disintegrating tablet 30 tablet 0    Sig: Take 1 tablet (0.5 mg total) by mouth daily.     Not Delegated - Psychiatry:  Anxiolytics/Hypnotics Failed - 11/03/2018 10:51 AM      Failed - This refill cannot be delegated      Failed - Urine Drug Screen completed in last 360 days.      Passed - Valid encounter within last 6 months    Recent Outpatient Visits          2 months ago Hyperlipidemia, unspecified hyperlipidemia type   [facility], MD   3 months ago GAD (generalized anxiety disorder)   [facility], MD   6 months ago Panic attacks   [facility] [place], MD   8 months ago Panic attacks   [facility], MD   1 year ago Mixed hyperlipidemia   [facility] [place], MD      Future Appointments            In 2 months Larose Kells, Alda Berthold, MD [facility] at [place]

## 2018-11-03 NOTE — Telephone Encounter (Signed)
I returned pt's call.   He is c/o chest discomfort not really a pain that has been going on for 4-5 yrs.  " I've talked with Dr. Larose Kells about this before and he is aware of it   I'm just really tender when I press on my chest". "Dr. Larose Kells diagnosed me with depression and anxiety".    "I'm not sure if this chest discomfort is related to the panic but the tenderness when I press certain places on my chest seems odd".    No one has been able to figure out what is wrong with me.  See triage notes.  Protocol is Go to ED or PCP triage.   Since this is an ongoing issue.  I called into  Dr. Ethel Rana office to see if they were willing to see him since this is an ongoing issue or if he should go to the ED.  Dr. Larose Kells got on the line and I updated him on what Gregory Gilmore issue was.   Dr. Larose Kells can see him today at 2:40.    I got back on the line with Gregory Gilmore and he can come at 2:40 today.    I let him know to arrive by 2:30 to be registered.    He was agreeable to this plan.  I sent my notes to Dr. Ethel Rana office.     Reason for Disposition . Patient sounds very sick or weak to the triager  Answer Assessment - Initial Assessment Questions 1. LOCATION: "Where does it hurt?"       It's the same thing that's been going on for 4-5 years.   It's been hurting each day.    There are certain points on my chest that are tender when I press on them.    My left arm will hurt.    I do smoke since I was a teenager. 2. RADIATION: "Does the pain go anywhere else?" (e.g., into neck, jaw, arms, back)     Left arm but it's been going on a long time. 3. ONSET: "When did the chest pain begin?" (Minutes, hours or days)      It has never stopped.   I just have good days and bad days.    4. PATTERN "Does the pain come and go, or has it been constant since it started?"  "Does it get worse with exertion?"      It fluctuates. 5. DURATION: "How long does it last" (e.g., seconds, minutes, hours)     It never really goes away.     At night  it feels like my heart flutters and it wakes me up.   I've had an endoscopy done and everything is fine.    6. SEVERITY: "How bad is the pain?"  (e.g., Scale 1-10; mild, moderate, or severe)    - MILD (1-3): doesn't interfere with normal activities     - MODERATE (4-7): interferes with normal activities or awakens from sleep    - SEVERE (8-10): excruciating pain, unable to do any normal activities       7 when pain is bad.  It's not a pain it's just a discomfort.  I've lost 25 lbs since July.    I've done no exercise so it's not that.   I'm very tired.    I spend most of my time in bed. 7. CARDIAC RISK FACTORS: "Do you have any history of heart problems or risk factors for heart disease?" (e.g., angina, prior heart attack;  diabetes, high blood pressure, high cholesterol, smoker, or strong family history of heart disease)     I've had EKGs, cardiac cath and everything is fine. 8. PULMONARY RISK FACTORS: "Do you have any history of lung disease?"  (e.g., blood clots in lung, asthma, emphysema, birth control pills)     No blood clots.    I'm a smoker since my teens.   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 9. CAUSE: "What do you think is causing the chest pain?"     I don't know.   I don't understand why I'm super sore in my chest. 10. OTHER SYMPTOMS: "Do you have any other symptoms?" (e.g., dizziness, nausea, vomiting, sweating, fever, difficulty breathing, cough)       sometimes I get clammy and cold.    11. PREGNANCY: "Is there any chance you are pregnant?" "When was your last menstrual period?"       N/A  Protocols used: CHEST PAIN-A-AH

## 2018-11-03 NOTE — Progress Notes (Signed)
Pre visit review using our clinic review tool, if applicable. No additional management support is needed unless otherwise documented below in the visit note. 

## 2018-11-03 NOTE — Patient Instructions (Addendum)
  GO TO THE FRONT DESK Schedule fasting labs to be done this week  Schedule a physical exam in 4 to 5 weeks from today  Take BuSpar every day and clonazepam as needed  Call if you have severe symptoms

## 2018-11-03 NOTE — Telephone Encounter (Signed)
Medication Refill: clonazePAM (KLONOPIN) 0.5 MG disintegrating tablet [[id number]]    Pharmacy:  [facility], [place] [phone] (Phone) [phone] (Fax)

## 2018-11-04 NOTE — Assessment & Plan Note (Signed)
Chest pain: Chronic, getting worse lately.  The patient was already experiencing increased pain before he went to [place] thus I doubt pain related to a PE. Panic is likely related anxiety and/or MSK. EKG: NSR, no acute Plan: Get a chest x-ray, sed rate.  Treat anxiety (he ran out of clonazepam).  Call anytime if symptoms are not improving.  Consider further eval.  CT angio?. Depression, GAD, panic attacks.  Currently on BuSpar, does not take it every day, recommend to do so.  Refill clonazepam. High cholesterol: On Crestor, based on last FLP Zetia was added, checking labs. Weight loss: Significant weight loss, he did say that he is trying to eat a little healthier but did not expect such dramatic weight loss.  Will do CBC, BMP TSH. RTC this week for labs and 4 weeks for CPX

## 2018-11-11 ENCOUNTER — Other Ambulatory Visit: Payer: 59

## 2018-11-12 ENCOUNTER — Other Ambulatory Visit (INDEPENDENT_AMBULATORY_CARE_PROVIDER_SITE_OTHER): Payer: 59

## 2018-11-12 ENCOUNTER — Other Ambulatory Visit: Payer: Self-pay

## 2018-11-12 DIAGNOSIS — R079 Chest pain, unspecified: Secondary | ICD-10-CM | POA: Diagnosis not present

## 2018-11-12 DIAGNOSIS — E785 Hyperlipidemia, unspecified: Secondary | ICD-10-CM | POA: Diagnosis not present

## 2018-11-12 DIAGNOSIS — F410 Panic disorder [episodic paroxysmal anxiety] without agoraphobia: Secondary | ICD-10-CM

## 2018-11-12 DIAGNOSIS — G4700 Insomnia, unspecified: Secondary | ICD-10-CM | POA: Diagnosis not present

## 2018-11-12 DIAGNOSIS — R634 Abnormal weight loss: Secondary | ICD-10-CM | POA: Diagnosis not present

## 2018-11-12 DIAGNOSIS — Z79899 Other long term (current) drug therapy: Secondary | ICD-10-CM

## 2018-11-12 LAB — CBC WITH DIFFERENTIAL/PLATELET
Basophils Absolute: 0.1 10*3/uL (ref 0.0–0.1)
Basophils Relative: 0.9 % (ref 0.0–3.0)
Eosinophils Absolute: 0.1 10*3/uL (ref 0.0–0.7)
Eosinophils Relative: 0.9 % (ref 0.0–5.0)
HCT: 47.7 % (ref 39.0–52.0)
Hemoglobin: 16 g/dL (ref 13.0–17.0)
Lymphocytes Relative: 31.8 % (ref 12.0–46.0)
Lymphs Abs: 2.2 10*3/uL (ref 0.7–4.0)
MCHC: 33.5 g/dL (ref 30.0–36.0)
MCV: 97 fl (ref 78.0–100.0)
Monocytes Absolute: 0.6 10*3/uL (ref 0.1–1.0)
Monocytes Relative: 8.1 % (ref 3.0–12.0)
Neutro Abs: 4 10*3/uL (ref 1.4–7.7)
Neutrophils Relative %: 58.3 % (ref 43.0–77.0)
Platelets: 235 10*3/uL (ref 150.0–400.0)
RBC: 4.91 Mil/uL (ref 4.22–5.81)
RDW: 13.2 % (ref 11.5–15.5)
WBC: 6.8 10*3/uL (ref 4.0–10.5)

## 2018-11-12 LAB — LIPID PANEL
Cholesterol: 174 mg/dL (ref 0–200)
HDL: 34.6 mg/dL — ABNORMAL LOW (ref >39.00)
LDL Cholesterol: 113 mg/dL — ABNORMAL HIGH (ref 0–99)
NonHDL: 139.01
Total CHOL/HDL Ratio: 5
Triglycerides: 131 mg/dL (ref 0.0–149.0)
VLDL: 26.2 mg/dL (ref 0.0–40.0)

## 2018-11-12 LAB — COMPREHENSIVE METABOLIC PANEL
ALT: 31 U/L (ref 0–53)
AST: 15 U/L (ref 0–37)
Albumin: 4.5 g/dL (ref 3.5–5.2)
Alkaline Phosphatase: 94 U/L (ref 39–117)
BUN: 17 mg/dL (ref 6–23)
CO2: 27 mEq/L (ref 19–32)
Calcium: 10 mg/dL (ref 8.4–10.5)
Chloride: 105 mEq/L (ref 96–112)
Creatinine, Ser: 1.04 mg/dL (ref 0.40–1.50)
GFR: 77.52 mL/min (ref >60.00)
Glucose, Bld: 105 mg/dL — ABNORMAL HIGH (ref 70–99)
Potassium: 4.2 mEq/L (ref 3.5–5.1)
Sodium: 140 mEq/L (ref 135–145)
Total Bilirubin: 0.7 mg/dL (ref 0.2–1.2)
Total Protein: 6.9 g/dL (ref 6.0–8.3)

## 2018-11-12 LAB — SEDIMENTATION RATE: Sed Rate: 11 mm/hr (ref 0–15)

## 2018-11-12 LAB — TSH: TSH: 1.28 u[IU]/mL (ref 0.35–4.50)

## 2018-11-14 LAB — PAIN MGMT, PROFILE 8 W/CONF, U
6 Acetylmorphine: NEGATIVE ng/mL
Alcohol Metabolites: NEGATIVE ng/mL (ref <500)
Alphahydroxyalprazolam: NEGATIVE ng/mL
Alphahydroxymidazolam: NEGATIVE ng/mL
Alphahydroxytriazolam: NEGATIVE ng/mL
Aminoclonazepam: 153 ng/mL
Amphetamines: NEGATIVE ng/mL
Benzodiazepines: POSITIVE ng/mL
Buprenorphine, Urine: NEGATIVE ng/mL
Cocaine Metabolite: NEGATIVE ng/mL
Creatinine: 241.8 mg/dL
Hydroxyethylflurazepam: NEGATIVE ng/mL
Lorazepam: NEGATIVE ng/mL
MDMA: NEGATIVE ng/mL
Marijuana Metabolite: NEGATIVE ng/mL
Nordiazepam: NEGATIVE ng/mL
Opiates: NEGATIVE ng/mL
Oxazepam: NEGATIVE ng/mL
Oxidant: NEGATIVE ug/mL
Oxycodone: NEGATIVE ng/mL
Temazepam: NEGATIVE ng/mL
pH: 5.5 (ref 4.5–9.0)

## 2018-12-01 ENCOUNTER — Other Ambulatory Visit: Payer: Self-pay

## 2018-12-02 ENCOUNTER — Ambulatory Visit (INDEPENDENT_AMBULATORY_CARE_PROVIDER_SITE_OTHER): Payer: 59 | Admitting: Internal Medicine

## 2018-12-02 ENCOUNTER — Encounter: Payer: Self-pay | Admitting: Internal Medicine

## 2018-12-02 ENCOUNTER — Other Ambulatory Visit: Payer: Self-pay

## 2018-12-02 VITALS — BP 103/69 | HR 91 | Temp 97.1°F | Resp 16 | Ht 74.0 in | Wt 227.1 lb

## 2018-12-02 DIAGNOSIS — F410 Panic disorder [episodic paroxysmal anxiety] without agoraphobia: Secondary | ICD-10-CM | POA: Diagnosis not present

## 2018-12-02 DIAGNOSIS — R079 Chest pain, unspecified: Secondary | ICD-10-CM

## 2018-12-02 DIAGNOSIS — F339 Major depressive disorder, recurrent, unspecified: Secondary | ICD-10-CM | POA: Diagnosis not present

## 2018-12-02 DIAGNOSIS — F411 Generalized anxiety disorder: Secondary | ICD-10-CM | POA: Diagnosis not present

## 2018-12-02 MED ORDER — ROSUVASTATIN CALCIUM 40 MG PO TABS: 40.0000 mg | ORAL_TABLET | Freq: Every day | ORAL | 1 refills | Status: DC

## 2018-12-02 MED ORDER — ZOLPIDEM TARTRATE 10 MG PO TABS: 5.0000 mg | ORAL_TABLET | Freq: Every evening | ORAL | 1 refills | Status: DC | PRN

## 2018-12-02 MED ORDER — BUSPIRONE HCL 10 MG PO TABS: 10.0000 mg | ORAL_TABLET | Freq: Two times a day (BID) | ORAL | 1 refills | Status: DC

## 2018-12-02 MED ORDER — EZETIMIBE 10 MG PO TABS: 10.0000 mg | ORAL_TABLET | Freq: Every day | ORAL | 1 refills | Status: DC

## 2018-12-02 NOTE — Progress Notes (Signed)
Subjective:    Patient ID: Gregory Gilmore, male    DOB: 23-Jun-1975, 43 y.o.   MRN: [id number]  DOS:  12/02/2018 Type of visit - description: Follow-up Since the last office visit he is doing better. Panic attacks: Back to baseline, has 1 or 2 episodes a week and they are mild or  "normal". Chest pain still there but he is managing well. Depression: Wonders about that, his personality has changed, years ago he was bubbly, outgoing, laughing a lot and lately he is not.  "I just go to work, work on the yard and go to bed" I asked him if he feels bad about it and he said no.  His "change" does not seem to affect his family either. No suicidal ideas.   Wt Readings from Last 3 Encounters:  12/02/18 227 lb 2 oz (103 kg)  11/03/18 227 lb 4 oz (103.1 kg)  04/10/18 248 lb 2 oz (112.5 kg)      Review of Systems Sleeps well.  Past Medical History:  Diagnosis Date  . Anxiety and depression   . GERD (gastroesophageal reflux disease)   . Hyperglycemia 07/28/2012  . Hyperlipidemia 07/28/2012    Past Surgical History:  Procedure Laterality Date  . NO PAST SURGERIES      Social History   Socioeconomic History  . Marital status: Married    Spouse name: Not on file  . Number of children: 2  . Years of education: Not on file  . Highest education level: Not on file  Occupational History  . Occupation: Neurosurgeon  . Financial resource strain: Not on file  . Food insecurity    Worry: Not on file    Inability: Not on file  . Transportation needs    Medical: Not on file    Non-medical: Not on file  Tobacco Use  . Smoking status: Current Every Day Smoker  . Smokeless tobacco: Never Used  . Tobacco comment: 1 ppd   Substance and Sexual Activity  . Alcohol use: No  . Drug use: No  . Sexual activity: Not on file  Lifestyle  . Physical activity    Days per week: Not on file    Minutes per session: Not on file  . Stress: Not on file  Relationships  . Social  Musician on phone: Not on file    Gets together: Not on file    Attends religious service: Not on file    Active member of club or organization: Not on file    Attends meetings of clubs or organizations: Not on file    Relationship status: Not on file  . Intimate partner violence    Fear of current or ex partner: Not on file    Emotionally abused: Not on file    Physically abused: Not on file    Forced sexual activity: Not on file  Other Topics Concern  . Not on file  Social History Narrative   Original from [place], father was Kapil Petropoulos a pt of mine deceased ~ 27-Feb-2012   Married, 2 children (one from wife)   finished HS, sales       Allergies as of 12/02/2018   No Known Allergies     Medication List       Accurate as of December 02, 2018  1:23 PM. If you have any questions, ask your nurse or doctor.        busPIRone 10 MG  tablet Commonly known as: BUSPAR Take 1 tablet (10 mg total) by mouth 2 (two) times daily.   clonazePAM 0.5 MG disintegrating tablet Commonly known as: KLONOPIN Take 1 tablet (0.5 mg total) by mouth daily.   ezetimibe 10 MG tablet Commonly known as: Zetia Take 1 tablet (10 mg total) by mouth at bedtime.   omeprazole 20 MG capsule Commonly known as: PRILOSEC Take 20 mg by mouth daily.   rosuvastatin 40 MG tablet Commonly known as: Crestor Take 1 tablet (40 mg total) by mouth at bedtime.   zolpidem 10 MG tablet Commonly known as: AMBIEN Take 0.5-1 tablets (5-10 mg total) by mouth at bedtime as needed for up to 30 days for sleep.           Objective:   Physical Exam BP 103/69 (BP Location: Left Arm, Patient Position: Sitting, Cuff Size: Normal)   Pulse 91   Temp (!) 97.1 F (36.2 C) (Temporal)   Resp 16   Ht 6\' 2"  (1.88 m)   Wt 227 lb 2 oz (103 kg)   SpO2 98%   BMI 29.16 kg/m  General:   Well developed, NAD, BMI noted. HEENT:  Normocephalic . Face symmetric, atraumatic  Skin: Not pale. Not jaundice Neurologic:   alert & oriented X3.  Speech normal, gait appropriate for age and unassisted Psych--  Cognition and judgment appear intact.  Cooperative with normal attention span and concentration.  Behavior appropriate. No anxious or depressed appearing.      Assessment       Assessment last seen 2015, reestablish 02/18/2017 GERD Depression, GAD, panic attacks (intolerant to Trintellix, Lexapro and sertraline caused erectile dysfunction) High cholesterol CP : w/u 2014, 2015, cath 2017 @ [facility] , 2017 EGD >> was concluded sx were d/t panik  CT angio 2015: (-) + FH Sarcoidosis  PLAN See last visit, work-up was negative. Chest pain: Unchanged, patient states "I  know is not my heart, I am not worried about it anymore".  No further eval at this time. Depression, GAD, panic attacks:  since the last office visit he feels better.  Still has panic attack "at baseline", 1 or 2 a week , they are mild and stop w/ clonazepam prn. He wonders about depression, (Epworth today= 7 )his personality has changed but he does not feel bad about it, see HPI.  Offered a referral to a psychologist to figure that out, declined, stated first will talk with his pastor. Still has some mild degree of anxiety throughout the day, on buspirone 10 mg, for some reason his only taking 1 tablet a day, trec to take BID as RX, and reassess on RTC. Declined a flu shot RTC 4 months

## 2018-12-02 NOTE — Patient Instructions (Addendum)
  GO TO THE FRONT DESK Schedule your next appointment   For a physical in 4 months

## 2018-12-02 NOTE — Progress Notes (Signed)
Pre visit review using our clinic review tool, if applicable. No additional management support is needed unless otherwise documented below in the visit note. 

## 2018-12-03 NOTE — Assessment & Plan Note (Signed)
See last visit, work-up was negative. Chest pain: Unchanged, patient states "I  know is not my heart, I am not worried about it anymore".  No further eval at this time. Depression, GAD, panic attacks:  since the last office visit he feels better.  Still has panic attack "at baseline", 1 or 2 a week , they are mild and stop w/ clonazepam prn. He wonders about depression, (Epworth today= 7 )his personality has changed but he does not feel bad about it, see HPI.  Offered a referral to a psychologist to figure that out, declined, stated first will talk with his pastor. Still has some mild degree of anxiety throughout the day, on buspirone 10 mg, for some reason his only taking 1 tablet a day, trec to take BID as RX, and reassess on RTC. Declined a flu shot RTC 4 months

## 2018-12-14 ENCOUNTER — Telehealth: Payer: Self-pay | Admitting: Internal Medicine

## 2018-12-14 MED ORDER — CLONAZEPAM 0.5 MG PO TBDP: 0.5000 mg | ORAL_TABLET | Freq: Every day | ORAL | 2 refills | Status: DC

## 2018-12-14 NOTE — Telephone Encounter (Signed)
Requested medication (s) are due for refill today: yes  Requested medication (s) are on the active medication list: yes  Last refill:  11/03/2018  Future visit scheduled: yes  Notes to clinic:  Pt is stating that pharmacy does not have this script has not had script since March and for him to reach out to PCP verified pharmacy    Requested Prescriptions  Pending Prescriptions Disp Refills   clonazePAM (KLONOPIN) 0.5 MG disintegrating tablet 30 tablet 2    Sig: Take 1 tablet (0.5 mg total) by mouth daily.     Not Delegated - Psychiatry:  Anxiolytics/Hypnotics Failed - 12/14/2018 12:22 PM      Failed - This refill cannot be delegated      Passed - Urine Drug Screen completed in last 360 days.      Passed - Valid encounter within last 6 months    Recent Outpatient Visits          1 week ago Depression, recurrent ([place])   [facility], MD   1 month ago Chest pain, unspecified type   [facility], MD   4 months ago Hyperlipidemia, unspecified hyperlipidemia type   [facility], MD   5 months ago GAD (generalized anxiety disorder)   [facility], MD   8 months ago Panic attacks   [facility], MD      Future Appointments            In 3 months Larose Kells, Alda Berthold, MD [facility] at [facility], [place]

## 2018-12-14 NOTE — Telephone Encounter (Signed)
clonazePAM (KLONOPIN) 0.5 MG disintegrating tablet  Pt is stating that pharmacy does not have this script has not had script since March and for him to reach out to PCP verified pharmacy  [facility], [place] [phone] (Phone) [phone] (Fax)

## 2018-12-14 NOTE — Telephone Encounter (Signed)
Spoke w/ Andee Poles at [facility] for Clonazepam on 11/03/2018 #30 and 2rf was deactivated on their end- she is unsure why- they will need a new Rx sent.

## 2018-12-14 NOTE — Telephone Encounter (Signed)
Sent!

## 2019-01-08 ENCOUNTER — Encounter: Payer: 59 | Admitting: Internal Medicine

## 2019-04-07 ENCOUNTER — Encounter: Payer: 59 | Admitting: Internal Medicine

## 2019-05-13 ENCOUNTER — Other Ambulatory Visit: Payer: Self-pay

## 2019-05-14 ENCOUNTER — Encounter: Payer: Self-pay | Admitting: Internal Medicine

## 2019-05-14 ENCOUNTER — Ambulatory Visit (INDEPENDENT_AMBULATORY_CARE_PROVIDER_SITE_OTHER): Payer: 59 | Admitting: Internal Medicine

## 2019-05-14 ENCOUNTER — Other Ambulatory Visit: Payer: Self-pay

## 2019-05-14 VITALS — BP 118/71 | HR 77 | Temp 97.4°F | Resp 18 | Ht 74.0 in | Wt 217.0 lb

## 2019-05-14 DIAGNOSIS — Z0000 Encounter for general adult medical examination without abnormal findings: Secondary | ICD-10-CM | POA: Diagnosis not present

## 2019-05-14 DIAGNOSIS — M546 Pain in thoracic spine: Secondary | ICD-10-CM | POA: Diagnosis not present

## 2019-05-14 DIAGNOSIS — E785 Hyperlipidemia, unspecified: Secondary | ICD-10-CM

## 2019-05-14 DIAGNOSIS — R739 Hyperglycemia, unspecified: Secondary | ICD-10-CM

## 2019-05-14 DIAGNOSIS — R2240 Localized swelling, mass and lump, unspecified lower limb: Secondary | ICD-10-CM | POA: Diagnosis not present

## 2019-05-14 MED ORDER — BUSPIRONE HCL 10 MG PO TABS: 10.0000 mg | ORAL_TABLET | Freq: Two times a day (BID) | ORAL | 1 refills | Status: DC

## 2019-05-14 MED ORDER — CYCLOBENZAPRINE HCL 10 MG PO TABS: 10.0000 mg | ORAL_TABLET | Freq: Every evening | ORAL | 0 refills | Status: DC | PRN

## 2019-05-14 MED ORDER — ROSUVASTATIN CALCIUM 40 MG PO TABS: 40.0000 mg | ORAL_TABLET | Freq: Every day | ORAL | 1 refills | Status: DC

## 2019-05-14 MED ORDER — EZETIMIBE 10 MG PO TABS: 10.0000 mg | ORAL_TABLET | Freq: Every day | ORAL | 1 refills | Status: DC

## 2019-05-14 NOTE — Progress Notes (Signed)
Pre visit review using our clinic review tool, if applicable. No additional management support is needed unless otherwise documented below in the visit note. 

## 2019-05-14 NOTE — Patient Instructions (Addendum)
For back pain: Stretch every morning  Try cyclobenzaprine a muscle relaxant at bedtime.  You can try Tylenol or ibuprofen as needed  IBUPROFEN (Advil or Motrin) 200 mg 2 tablets every 6 hours as needed for pain.  Always take it with food because may cause gastritis and ulcers.  If you notice nausea, stomach pain, change in the color of stools --->  Stop the medicine and let us know  Call if not gradually better  Go back on Zetia  GO TO THE FRONT DESK, PLEASE SCHEDULE YOUR APPOINTMENTS Come back for   blood work only in 2 weeks, fasting  Come back for a checkup in 6 months   Monday through Friday, from 8 AM to 5 PM go to the [facility] office located across from [facility] at [facility].  Get your x-ray at the basement

## 2019-05-14 NOTE — Progress Notes (Signed)
Subjective:    Patient ID: Gregory Gilmore, male    DOB: 08-17-1975, 44 y.o.   MRN: [id number]  DOS:  05/14/2019 Type of visit - description: here for CPX Here for CPX, other  issues were discussed as well. Many years history of a knot at the left pretibial area, it has not changed For the last few months he is experienced pain at the R-back- thoracic area, no radiation, has only taking occasional NSAIDs, he does very heavy lifting at work and that make it worse. Denies any extremity paresthesias.    Review of Systems  Other than above, a 14 point review of systems is negative    Past Medical History:  Diagnosis Date  . Anxiety and depression   . GERD (gastroesophageal reflux disease)   . Hyperglycemia 07/28/2012  . Hyperlipidemia 07/28/2012    Past Surgical History:  Procedure Laterality Date  . NO PAST SURGERIES     Family History  Problem Relation Age of Onset  . CAD Father        cabg 8 y/o  . Diabetes Father   . Sarcoidosis Mother   . Sarcoidosis Maternal Uncle   . Colon cancer Neg Hx   . Prostate cancer Neg Hx      Allergies as of 05/14/2019   No Known Allergies     Medication List       Accurate as of May 14, 2019 11:59 PM. If you have any questions, ask your nurse or doctor.        busPIRone 10 MG tablet Commonly known as: BUSPAR Take 1 tablet (10 mg total) by mouth 2 (two) times daily.   clonazePAM 0.5 MG disintegrating tablet Commonly known as: KLONOPIN Take 1 tablet (0.5 mg total) by mouth daily.   cyclobenzaprine 10 MG tablet Commonly known as: FLEXERIL Take 1 tablet (10 mg total) by mouth at bedtime as needed for muscle spasms. Started by: Willow Ora, MD   ezetimibe 10 MG tablet Commonly known as: Zetia Take 1 tablet (10 mg total) by mouth at bedtime.   omeprazole 20 MG capsule Commonly known as: PRILOSEC Take 20 mg by mouth daily.   rosuvastatin 40 MG tablet Commonly known as: Crestor Take 1 tablet (40 mg total) by mouth at  bedtime.   zolpidem 10 MG tablet Commonly known as: AMBIEN Take 0.5-1 tablets (5-10 mg total) by mouth at bedtime as needed for sleep.          Objective:   Physical Exam Musculoskeletal:       Arms:       Legs:    BP 118/71 (BP Location: Left Arm, Patient Position: Sitting, Cuff Size: Normal)   Pulse 77   Temp (!) 97.4 F (36.3 C) (Temporal)   Resp 18   Ht 6\' 2"  (1.88 m)   Wt 217 lb (98.4 kg)   SpO2 99%   BMI 27.86 kg/m  General: Well developed, NAD, BMI noted Neck: No  thyromegaly  HEENT:  Normocephalic . Face symmetric, atraumatic Lungs:  CTA B Normal respiratory effort, no intercostal retractions, no accessory muscle use. Heart: RRR,  no murmur.  Abdomen:  Not distended, soft, non-tender. No rebound or rigidity.   Lower extremities: no pretibial edema bilaterally  Skin: Exposed areas without rash. Not pale. Not jaundice Neurologic:  alert & oriented X3.  Speech normal, gait appropriate for age and unassisted Strength symmetric and appropriate for age.  Psych: Cognition and judgment appear intact.  Cooperative with normal attention  span and concentration.  Behavior appropriate. No anxious or depressed appearing.     Assessment       Assessment last seen 2015, reestablish 02/18/2017 GERD Depression, GAD, panic attacks (intolerant to Trintellix, Lexapro and sertraline caused erectile dysfunction) High cholesterol CP : w/u 2014, 2015, cath 2017 @ [facility] , 2017 EGD >> was concluded sx were d/t panik  CT angio 2015: (-) + FH Sarcoidosis, CAD  PLAN For CPX, other issues were discussed. Depression GAD panic attacks: Controlled, refill meds High cholesterol: Good compliance with rosuvastatin, ran out of Zetia, RF sent, check labs in 2 weeks Thoracic back pain: As described above, recommend to see Ortho and possibly PT, declines.  States is very difficult for him to go to medical appointments particularly physical therapy. We agreed on: X-ray, see  instructions.  Tylenol and ibuprofen with GI precautions, Flexeril, stretching, heating pads.  Call if not better Subcutaneous mass: See physical exam, has been there for years, recommend observation RTC labs 2 weeks RTC follow-up 6 months  In addition of CPX, we discussed depression, high cholesterol and 2 new symptoms: Thoracic pain none a subcutaneous mass.  This visit occurred during the SARS-CoV-2 public health emergency.  Safety protocols were in place, including screening questions prior to the visit, additional usage of staff PPE, and extensive cleaning of exam room while observing appropriate contact time as indicated for disinfecting solutions.

## 2019-05-16 NOTE — Assessment & Plan Note (Signed)
For CPX, other issues were discussed. Depression GAD panic attacks: Controlled, refill meds High cholesterol: Good compliance with rosuvastatin, ran out of Zetia, RF sent, check labs in 2 weeks Thoracic back pain: As described above, recommend to see Ortho and possibly PT, declines.  States is very difficult for him to go to medical appointments particularly physical therapy. We agreed on: X-ray, see instructions.  Tylenol and ibuprofen with GI precautions, Flexeril, stretching, heating pads.  Call if not better Subcutaneous mass: See physical exam, has been there for years, recommend observation RTC labs 2 weeks RTC follow-up 6 months

## 2019-05-16 NOTE — Assessment & Plan Note (Signed)
-  Tdap 2014 -Covid shot recommended pros>> cons discussed. -CCS: No family history, reassess next year  (+) Family history of CAD, no symptoms -Tobacco: 1 pack a day, not ready to quit -Labs: Will return fasting: CMP, FLP, A1c

## 2019-05-24 ENCOUNTER — Ambulatory Visit (INDEPENDENT_AMBULATORY_CARE_PROVIDER_SITE_OTHER)
Admission: RE | Admit: 2019-05-24 | Discharge: 2019-05-24 | Disposition: A | Discharge status: Home / Self Care | Payer: 59 | Source: Ambulatory Visit | Attending: Internal Medicine | Admitting: Internal Medicine

## 2019-05-24 ENCOUNTER — Other Ambulatory Visit: Payer: Self-pay

## 2019-05-24 DIAGNOSIS — M546 Pain in thoracic spine: Secondary | ICD-10-CM | POA: Diagnosis not present

## 2019-05-25 ENCOUNTER — Other Ambulatory Visit: Payer: Self-pay

## 2019-05-28 ENCOUNTER — Other Ambulatory Visit: Payer: 59

## 2019-05-31 ENCOUNTER — Other Ambulatory Visit (INDEPENDENT_AMBULATORY_CARE_PROVIDER_SITE_OTHER): Payer: 59

## 2019-05-31 ENCOUNTER — Other Ambulatory Visit: Payer: Self-pay

## 2019-05-31 DIAGNOSIS — R739 Hyperglycemia, unspecified: Secondary | ICD-10-CM | POA: Diagnosis not present

## 2019-05-31 DIAGNOSIS — Z0000 Encounter for general adult medical examination without abnormal findings: Secondary | ICD-10-CM

## 2019-05-31 DIAGNOSIS — E785 Hyperlipidemia, unspecified: Secondary | ICD-10-CM

## 2019-06-01 LAB — COMPREHENSIVE METABOLIC PANEL
ALT: 25 U/L (ref 0–53)
AST: 16 U/L (ref 0–37)
Albumin: 4.4 g/dL (ref 3.5–5.2)
Alkaline Phosphatase: 75 U/L (ref 39–117)
BUN: 18 mg/dL (ref 6–23)
CO2: 29 mEq/L (ref 19–32)
Calcium: 9.1 mg/dL (ref 8.4–10.5)
Chloride: 105 mEq/L (ref 96–112)
Creatinine, Ser: 0.83 mg/dL (ref 0.40–1.50)
GFR: 100.32 mL/min (ref >60.00)
Glucose, Bld: 87 mg/dL (ref 70–99)
Potassium: 4 mEq/L (ref 3.5–5.1)
Sodium: 140 mEq/L (ref 135–145)
Total Bilirubin: 0.6 mg/dL (ref 0.2–1.2)
Total Protein: 6.8 g/dL (ref 6.0–8.3)

## 2019-06-01 LAB — LIPID PANEL
Cholesterol: 131 mg/dL (ref 0–200)
HDL: 33.5 mg/dL — ABNORMAL LOW (ref >39.00)
LDL Cholesterol: 73 mg/dL (ref 0–99)
NonHDL: 97.29
Total CHOL/HDL Ratio: 4
Triglycerides: 119 mg/dL (ref 0.0–149.0)
VLDL: 23.8 mg/dL (ref 0.0–40.0)

## 2019-06-01 LAB — HEMOGLOBIN A1C: Hgb A1c MFr Bld: 5.6 % (ref 4.6–6.5)

## 2019-06-02 ENCOUNTER — Other Ambulatory Visit: Payer: Self-pay

## 2019-06-02 MED ORDER — CYCLOBENZAPRINE HCL 10 MG PO TABS: 10.0000 mg | ORAL_TABLET | Freq: Every evening | ORAL | 0 refills | Status: DC | PRN

## 2019-11-10 ENCOUNTER — Encounter: Payer: Self-pay | Admitting: Internal Medicine

## 2019-11-10 ENCOUNTER — Ambulatory Visit: Payer: 59 | Admitting: Internal Medicine

## 2019-11-10 ENCOUNTER — Other Ambulatory Visit: Payer: Self-pay

## 2019-11-10 VITALS — BP 114/78 | HR 72 | Temp 98.3°F | Resp 16 | Ht 74.0 in | Wt 216.0 lb

## 2019-11-10 DIAGNOSIS — Z79899 Other long term (current) drug therapy: Secondary | ICD-10-CM | POA: Diagnosis not present

## 2019-11-10 DIAGNOSIS — G4700 Insomnia, unspecified: Secondary | ICD-10-CM

## 2019-11-10 DIAGNOSIS — F321 Major depressive disorder, single episode, moderate: Secondary | ICD-10-CM

## 2019-11-10 DIAGNOSIS — F32A Depression, unspecified: Secondary | ICD-10-CM

## 2019-11-10 DIAGNOSIS — F419 Anxiety disorder, unspecified: Secondary | ICD-10-CM

## 2019-11-10 MED ORDER — BUSPIRONE HCL 10 MG PO TABS: 10.0000 mg | ORAL_TABLET | Freq: Two times a day (BID) | ORAL | 1 refills | Status: DC

## 2019-11-10 MED ORDER — ROSUVASTATIN CALCIUM 40 MG PO TABS: 40.0000 mg | ORAL_TABLET | Freq: Every day | ORAL | 1 refills | Status: DC

## 2019-11-10 MED ORDER — CLONAZEPAM 0.5 MG PO TBDP: 0.5000 mg | ORAL_TABLET | Freq: Every day | ORAL | 2 refills | Status: DC

## 2019-11-10 MED ORDER — EZETIMIBE 10 MG PO TABS: 10.0000 mg | ORAL_TABLET | Freq: Every day | ORAL | 1 refills | Status: DC

## 2019-11-10 NOTE — Assessment & Plan Note (Signed)
Depression, GAD, panic attacks, Insomnia: PHQ-9 today: 8, mild, continue BuSpar, clonazepam.  States does not need Ambien.  Check UDS, RF clonazepam Thoracic back pain: At baseline, on Flexeril. Preventive care: He will not take the flu shot or a Covid vaccine.  Extensive discussion about the pros and cons of Covid vaccine, encouraged to think about. RTC 05-2020 CPX.

## 2019-11-10 NOTE — Patient Instructions (Signed)
  GO TO THE LAB : Provide a urine sample   GO TO THE FRONT DESK, PLEASE SCHEDULE YOUR APPOINTMENTS Come back for   a physical exam in April 2022

## 2019-11-10 NOTE — Progress Notes (Signed)
Subjective:    Patient ID: Gregory Gilmore, male    DOB: 12-01-1975, 44 y.o.   MRN: [id number]  DOS:  11/10/2019 Type of visit - description: Follow-up No new concerns. Emotionally doing well. Thoracic back pain at baseline. Sleeping well, does not feel he needs Ambien.   Review of Systems Denies suicidal ideas  Past Medical History:  Diagnosis Date  . Anxiety and depression   . GERD (gastroesophageal reflux disease)   . Hyperglycemia 07/28/2012  . Hyperlipidemia 07/28/2012    Past Surgical History:  Procedure Laterality Date  . NO PAST SURGERIES      Allergies as of 11/10/2019   No Known Allergies     Medication List       Accurate as of November 10, 2019  8:29 AM. If you have any questions, ask your nurse or doctor.        busPIRone 10 MG tablet Commonly known as: BUSPAR Take 1 tablet (10 mg total) by mouth 2 (two) times daily.   clonazePAM 0.5 MG disintegrating tablet Commonly known as: KLONOPIN Take 1 tablet (0.5 mg total) by mouth daily.   cyclobenzaprine 10 MG tablet Commonly known as: FLEXERIL Take 1 tablet (10 mg total) by mouth at bedtime as needed for muscle spasms.   ezetimibe 10 MG tablet Commonly known as: Zetia Take 1 tablet (10 mg total) by mouth at bedtime.   omeprazole 20 MG capsule Commonly known as: PRILOSEC Take 20 mg by mouth daily.   rosuvastatin 40 MG tablet Commonly known as: Crestor Take 1 tablet (40 mg total) by mouth at bedtime.   zolpidem 10 MG tablet Commonly known as: AMBIEN Take 0.5-1 tablets (5-10 mg total) by mouth at bedtime as needed for sleep.          Objective:   Physical Exam BP 114/78 (BP Location: Left Arm, Patient Position: Sitting, Cuff Size: Normal)   Pulse 72   Temp 98.3 F (36.8 C) (Oral)   Resp 16   Ht 6\' 2"  (1.88 m)   Wt 216 lb (98 kg)   SpO2 97%   BMI 27.73 kg/m  General:   Well developed, NAD, BMI noted. HEENT:  Normocephalic . Face symmetric, atraumatic Lungs:  CTA B Normal  respiratory effort, no intercostal retractions, no accessory muscle use. Heart: RRR,  no murmur.  Lower extremities: no pretibial edema bilaterally  Skin: Not pale. Not jaundice Neurologic:  alert & oriented X3.  Speech normal, gait appropriate for age and unassisted Psych--  Cognition and judgment appear intact.  Cooperative with normal attention span and concentration.  Behavior appropriate. No anxious or depressed appearing.      Assessment      Assessment last seen 2015, reestablish 02/18/2017 GERD Depression, GAD, panic attacks, insomnia (intolerant to Trintellix, Lexapro and sertraline caused erectile dysfunction) High cholesterol CP : w/u 2014, 2015, cath 2017 @ [facility] , 2017 EGD >> was concluded sx were d/t panik  CT angio 2015: (-) + FH Sarcoidosis, CAD  PLAN Depression, GAD, panic attacks, Insomnia: PHQ-9 today: 8, mild, continue BuSpar, clonazepam.  States does not need Ambien.  Check UDS, RF clonazepam Thoracic back pain: At baseline, on Flexeril. Preventive care: He will not take the flu shot or a Covid vaccine.  Extensive discussion about the pros and cons of Covid vaccine, encouraged to think about. RTC 05-2020 CPX.   This visit occurred during the SARS-CoV-2 public health emergency.  Safety protocols were in place, including screening questions prior to the visit,  additional usage of staff PPE, and extensive cleaning of exam room while observing appropriate contact time as indicated for disinfecting solutions.

## 2019-11-12 LAB — DRUG MONITORING, PANEL 8 WITH CONFIRMATION, URINE
6 Acetylmorphine: NEGATIVE ng/mL (ref <10)
Alcohol Metabolites: POSITIVE ng/mL — AB
Amphetamines: NEGATIVE ng/mL (ref <500)
Benzodiazepines: NEGATIVE ng/mL (ref <100)
Buprenorphine, Urine: NEGATIVE ng/mL (ref <5)
Cocaine Metabolite: NEGATIVE ng/mL (ref <150)
Creatinine: 154 mg/dL
Ethyl Glucuronide (ETG): 542 ng/mL — ABNORMAL HIGH (ref <500)
Ethyl Sulfate (ETS): 125 ng/mL — ABNORMAL HIGH (ref <100)
MDMA: NEGATIVE ng/mL (ref <500)
Marijuana Metabolite: NEGATIVE ng/mL (ref <20)
Opiates: NEGATIVE ng/mL (ref <100)
Oxidant: NEGATIVE ug/mL
Oxycodone: NEGATIVE ng/mL (ref <100)
pH: 7 (ref 4.5–9.0)

## 2019-11-12 LAB — DM TEMPLATE

## 2019-11-14 ENCOUNTER — Other Ambulatory Visit: Payer: Self-pay | Admitting: Internal Medicine

## 2020-01-04 ENCOUNTER — Telehealth: Payer: Self-pay | Admitting: Internal Medicine

## 2020-01-04 MED ORDER — CLONAZEPAM 0.5 MG PO TBDP: 0.5000 mg | ORAL_TABLET | Freq: Every day | ORAL | 2 refills | Status: DC

## 2020-01-04 NOTE — Telephone Encounter (Signed)
Prescription sent

## 2020-01-04 NOTE — Telephone Encounter (Signed)
Patient states clonazepam is on back order at [facility]. He would like to transfer the prescription to a new pharmacy    clonazePAM (KLONOPIN) 0.5 MG disintegrating tablet [[id number]]   NEW PHARMACY    [id number] - [place], [place] - [address] AT [facility] [address]

## 2020-01-04 NOTE — Telephone Encounter (Signed)
Pt called wanting RX sent to [facility] in [place] at [address] due to his other pharmacy not having RX in stock.

## 2020-03-13 ENCOUNTER — Other Ambulatory Visit: Payer: Self-pay | Admitting: Internal Medicine

## 2020-03-14 ENCOUNTER — Telehealth: Payer: Self-pay

## 2020-03-14 MED ORDER — CLONAZEPAM 0.5 MG PO TBDP
0.5000 mg | ORAL_TABLET | Freq: Every day | ORAL | 3 refills | Status: DC
Start: 2020-03-14 — End: 2020-05-18

## 2020-03-14 NOTE — Telephone Encounter (Signed)
PDMP okay, Rx sent 

## 2020-03-14 NOTE — Telephone Encounter (Signed)
Requesting: clonazepam 0.5mg  Contract: 02/16/2018 UDS: 11/10/2019 Last Visit: 11/10/2019 Next Visit: 05/12/2020 Last Refill: 01/04/2020 #30 and 2RF  Please Advise

## 2020-05-12 ENCOUNTER — Telehealth: Payer: Self-pay | Admitting: Internal Medicine

## 2020-05-12 ENCOUNTER — Ambulatory Visit: Payer: 59 | Admitting: Internal Medicine

## 2020-05-12 ENCOUNTER — Other Ambulatory Visit: Payer: Self-pay | Admitting: Internal Medicine

## 2020-05-12 NOTE — Telephone Encounter (Signed)
Medication: clonazePAM (KLONOPIN) 0.5 MG disintegrating tablet   cyclobenzaprine (FLEXERIL) 10 MG tablet [[id number]]    Has the patient contacted their pharmacy? No. (If no, request that the patient contact the pharmacy for the refill.) (If yes, when and what did the pharmacy advise?)  Preferred Pharmacy (with phone number or street name):  [id number] - [place], [place] - [address]  [address], [place] [address]  Phone:  [phone] Fax:  [phone]  DEA #:  --  [id number] Reason: --     Agent: Please be advised that RX refills may take up to 3 business days. We ask that you follow-up with your pharmacy.

## 2020-05-12 NOTE — Telephone Encounter (Deleted)
Requesting: clonazepam 0.5mg  Contract: UDS: Last Visit:  Next Visit: Last Refill: 03/14/20  Please Advise

## 2020-05-12 NOTE — Telephone Encounter (Signed)
Refills were sent on 03/14/20 #30 and 3RF.

## 2020-05-18 ENCOUNTER — Encounter: Payer: Self-pay | Admitting: Internal Medicine

## 2020-05-18 ENCOUNTER — Other Ambulatory Visit: Payer: Self-pay

## 2020-05-18 ENCOUNTER — Ambulatory Visit: Payer: 59 | Admitting: Internal Medicine

## 2020-05-18 VITALS — BP 108/72 | HR 75 | Temp 97.3°F | Ht 74.0 in | Wt 227.9 lb

## 2020-05-18 DIAGNOSIS — F410 Panic disorder [episodic paroxysmal anxiety] without agoraphobia: Secondary | ICD-10-CM | POA: Diagnosis not present

## 2020-05-18 DIAGNOSIS — F329 Major depressive disorder, single episode, unspecified: Secondary | ICD-10-CM

## 2020-05-18 DIAGNOSIS — E785 Hyperlipidemia, unspecified: Secondary | ICD-10-CM | POA: Diagnosis not present

## 2020-05-18 DIAGNOSIS — Z0000 Encounter for general adult medical examination without abnormal findings: Secondary | ICD-10-CM | POA: Diagnosis not present

## 2020-05-18 LAB — BASIC METABOLIC PANEL
BUN: 14 mg/dL (ref 6–23)
CO2: 25 mEq/L (ref 19–32)
Calcium: 9.3 mg/dL (ref 8.4–10.5)
Chloride: 105 mEq/L (ref 96–112)
Creatinine, Ser: 0.9 mg/dL (ref 0.40–1.50)
GFR: 103.02 mL/min (ref >60.00)
Glucose, Bld: 93 mg/dL (ref 70–99)
Potassium: 4.4 mEq/L (ref 3.5–5.1)
Sodium: 138 mEq/L (ref 135–145)

## 2020-05-18 LAB — CBC WITH DIFFERENTIAL/PLATELET
Basophils Absolute: 0 10*3/uL (ref 0.0–0.1)
Basophils Relative: 0.7 % (ref 0.0–3.0)
Eosinophils Absolute: 0.2 10*3/uL (ref 0.0–0.7)
Eosinophils Relative: 2.2 % (ref 0.0–5.0)
HCT: 46.3 % (ref 39.0–52.0)
Hemoglobin: 16 g/dL (ref 13.0–17.0)
Lymphocytes Relative: 34 % (ref 12.0–46.0)
Lymphs Abs: 2.4 10*3/uL (ref 0.7–4.0)
MCHC: 34.5 g/dL (ref 30.0–36.0)
MCV: 95.9 fl (ref 78.0–100.0)
Monocytes Absolute: 0.6 10*3/uL (ref 0.1–1.0)
Monocytes Relative: 8 % (ref 3.0–12.0)
Neutro Abs: 4 10*3/uL (ref 1.4–7.7)
Neutrophils Relative %: 55.1 % (ref 43.0–77.0)
Platelets: 237 10*3/uL (ref 150.0–400.0)
RBC: 4.82 Mil/uL (ref 4.22–5.81)
RDW: 13.6 % (ref 11.5–15.5)
WBC: 7.2 10*3/uL (ref 4.0–10.5)

## 2020-05-18 LAB — LIPID PANEL
Cholesterol: 237 mg/dL — ABNORMAL HIGH (ref 0–200)
HDL: 36.1 mg/dL — ABNORMAL LOW (ref >39.00)
LDL Cholesterol: 176 mg/dL — ABNORMAL HIGH (ref 0–99)
NonHDL: 201.36
Total CHOL/HDL Ratio: 7
Triglycerides: 128 mg/dL (ref 0.0–149.0)
VLDL: 25.6 mg/dL (ref 0.0–40.0)

## 2020-05-18 LAB — TSH: TSH: 0.93 u[IU]/mL (ref 0.35–4.50)

## 2020-05-18 MED ORDER — CLONAZEPAM 0.5 MG PO TBDP: 0.5000 mg | ORAL_TABLET | Freq: Three times a day (TID) | ORAL | 0 refills | Status: DC | PRN

## 2020-05-18 NOTE — Patient Instructions (Addendum)
Per our records you are due for your diabetic eye exam. Please contact your eye doctor to schedule an appointment. Please have them send copies of your office visit notes to [place]. Our fax number is [phone]. If you need a referral to an eye doctor please let us know.   Increase clonazepam to 1 tablet every 8 hours as needed  See a psychiatrist  It is extremely important you take both cholesterol medications every day (Zetia and Crestor)    GO TO THE LAB : Get the blood work     GO TO THE FRONT DESK, PLEASE SCHEDULE YOUR APPOINTMENTS Come back for a checkup in 4 to 6 months

## 2020-05-18 NOTE — Progress Notes (Signed)
Subjective:    Patient ID: Gregory Gilmore, male    DOB: [birth year], 45 y.o.   MRN: [id number]  DOS:  05/18/2020 Type of visit - description: CPX Here for CPX, we also talk about his chronic medical condition. Anxiety, panic attack, some depression: Self discontinued BuSpar.  It was not helping according to the patient.  Panic attacks getting worse   Wt Readings from Last 3 Encounters:  05/18/20 227 lb 14.4 oz (103.4 kg)  11/10/19 216 lb (98 kg)  05/14/19 217 lb (98.4 kg)     Review of Systems Having chest pain only in the context of panic attacks. Denies any suicidal or violent ideas Not taking cholesterol medication regularly  Other than above, a 14 point review of systems is negative      Past Medical History:  Diagnosis Date  . Anxiety and depression   . GERD (gastroesophageal reflux disease)   . Hyperglycemia 07/28/2012  . Hyperlipidemia 07/28/2012    Past Surgical History:  Procedure Laterality Date  . NO PAST SURGERIES      Allergies as of 05/18/2020   No Known Allergies     Medication List       Accurate as of May 18, 2020 11:59 PM. If you have any questions, ask your nurse or doctor.        STOP taking these medications   busPIRone 10 MG tablet Commonly known as: BUSPAR Stopped by: Willow Ora, MD     TAKE these medications   clonazePAM 0.5 MG disintegrating tablet Commonly known as: KLONOPIN Take 1 tablet (0.5 mg total) by mouth 3 (three) times daily as needed (Anxiety, panic attacks). What changed:   when to take this  reasons to take this Changed by: Willow Ora, MD   cyclobenzaprine 10 MG tablet Commonly known as: FLEXERIL Take 1 tablet (10 mg total) by mouth at bedtime as needed for muscle spasms.   ezetimibe 10 MG tablet Commonly known as: Zetia Take 1 tablet (10 mg total) by mouth at bedtime.   omeprazole 20 MG capsule Commonly known as: PRILOSEC Take 20 mg by mouth daily.   rosuvastatin 40 MG tablet Commonly known as:  Crestor Take 1 tablet (40 mg total) by mouth at bedtime.          Objective:   Physical Exam BP 108/72 (BP Location: Left Arm, Patient Position: Sitting, Cuff Size: Large)   Pulse 75   Temp (!) 97.3 F (36.3 C) (Temporal)   Ht 6\' 2"  (1.88 m)   Wt 227 lb 14.4 oz (103.4 kg)   SpO2 98%   BMI 29.26 kg/m  General: Well developed, NAD, BMI noted Neck: No  thyromegaly  HEENT:  Normocephalic . Face symmetric, atraumatic Lungs:  CTA B Normal respiratory effort, no intercostal retractions, no accessory muscle use. Heart: RRR,  no murmur.  Abdomen:  Not distended, soft, non-tender. No rebound or rigidity.   Lower extremities: no pretibial edema bilaterally  Skin: Exposed areas without rash. Not pale. Not jaundice Neurologic:  alert & oriented X3.  Speech normal, gait appropriate for age and unassisted Strength symmetric and appropriate for age.  Psych: Cognition and judgment appear intact.  Cooperative with normal attention span and concentration.  Behavior appropriate. No anxious or depressed appearing.     Assessment      Assessment last seen 2015, reestablish 02/18/2017 GERD Depression, GAD, panic attacks, insomnia (intolerant to Trintellix, Lexapro sertraline caused erectile dysfunction) High cholesterol CP : w/u 2014, 2015, cath 2017 @  [facility] , 2017 EGD >> was concluded sx were d/t panik  CT angio 2015: (-) + FH Sarcoidosis, CAD  PLAN Here for CPX, all issues discussed Depression, GAD, panic attacks, insomnia: Family told him that BuSpar was making him irritable, he self d/c it, reports his panic attacks are more intense and frequent lately. PHQ-9 today: 16, no suicidal He has been intolerant to a number of medicines, plan: Increase Klonopin to 3 times daily, rec  to see psychiatry, information provided. High cholesterol: Not taking meds regularly, reminded patient that he has + FH at his baseline LDL was in the 190s.  Strongly recommend to take cholesterol medication  daily. Snoring: Epworth scale negative today. RTC 4 to 6 months   This visit occurred during the SARS-CoV-2 public health emergency.  Safety protocols were in place, including screening questions prior to the visit, additional usage of staff PPE, and extensive cleaning of exam room while observing appropriate contact time as indicated for disinfecting solutions.

## 2020-05-20 ENCOUNTER — Encounter: Payer: Self-pay | Admitting: Internal Medicine

## 2020-05-20 NOTE — Assessment & Plan Note (Signed)
-  Tdap 2014 -Covid shot recommended pros>> cons discussed.  Reports he will not proceed. -CCS: No family history, options discussed, elected FIT (+) Family history of CAD, no new sxs -Tobacco: 1 pack a day, not ready to quit -Labs:  CMP, FLP, CBC, TSH, I fob

## 2020-05-20 NOTE — Assessment & Plan Note (Signed)
Here for CPX, all issues discussed Depression, GAD, panic attacks, insomnia: Family told him that BuSpar was making him irritable, he self d/c it, reports his panic attacks are more intense and frequent lately. PHQ-9 today: 16, no suicidal He has been intolerant to a number of medicines, plan: Increase Klonopin to 3 times daily, rec  to see psychiatry, information provided. High cholesterol: Not taking meds regularly, reminded patient that he has + FH at his baseline LDL was in the 190s.  Strongly recommend to take cholesterol medication daily. Snoring: Epworth scale negative today. RTC 4 to 6 months

## 2020-05-22 MED ORDER — ROSUVASTATIN CALCIUM 40 MG PO TABS: 40.0000 mg | ORAL_TABLET | Freq: Every day | ORAL | 1 refills | Status: DC

## 2020-05-22 MED ORDER — EZETIMIBE 10 MG PO TABS: 10.0000 mg | ORAL_TABLET | Freq: Every day | ORAL | 1 refills | Status: DC

## 2020-05-22 NOTE — Addendum Note (Signed)
Addended byConrad [place] D on: 05/22/2020 04:47 PM   Modules accepted: Orders

## 2020-06-02 ENCOUNTER — Ambulatory Visit: Payer: 59 | Admitting: Behavioral Health

## 2020-06-19 ENCOUNTER — Encounter: Payer: Self-pay | Admitting: Internal Medicine

## 2020-07-03 ENCOUNTER — Other Ambulatory Visit: Payer: Self-pay | Admitting: Internal Medicine

## 2020-07-04 ENCOUNTER — Telehealth: Payer: Self-pay | Admitting: Internal Medicine

## 2020-07-04 MED ORDER — CYCLOBENZAPRINE HCL 10 MG PO TABS: 10.0000 mg | ORAL_TABLET | Freq: Every evening | ORAL | 0 refills | Status: DC | PRN

## 2020-07-04 NOTE — Telephone Encounter (Signed)
PDMP: He just receive 30 tablets yesterday, too early to review.

## 2020-07-04 NOTE — Telephone Encounter (Signed)
Medication: clonazePAM (KLONOPIN) 0.5 MG disintegrating tablet [[id number]]   cyclobenzaprine (FLEXERIL) 10 MG tablet [[id number]]      Has the patient contacted their pharmacy? yes (If no, request that the patient contact the pharmacy for the refill.) (If yes, when and what did the pharmacy advise?) call your doc     Preferred Pharmacy (with phone number or street name):  [id number] - [place], [place] - [address]  [address], [place] [address]  Phone:  [phone] Fax:  [phone]     Agent: Please be advised that RX refills may take up to 3 business days. We ask that you follow-up with your pharmacy.

## 2020-07-04 NOTE — Telephone Encounter (Signed)
Requesting: clonazepam 0.5mg  disintegrating tab Contract: 11/10/2019 UDS:11/10/2019 Last Visit:05/18/2020 Next Visit:11/10/2020 Last Refill: 05/18/2020 #90 and 0RF Pt sig: 1 tab tid prn  Please Advise

## 2020-07-18 MED ORDER — CLONAZEPAM 0.5 MG PO TBDP: 0.5000 mg | ORAL_TABLET | Freq: Three times a day (TID) | ORAL | 1 refills | Status: DC | PRN

## 2020-07-18 NOTE — Telephone Encounter (Signed)
Please advise 

## 2020-07-18 NOTE — Telephone Encounter (Signed)
Patient states on his last visit dosage was change to 3 a day. Please advise

## 2020-07-18 NOTE — Telephone Encounter (Signed)
Agree, clonazepam increased to 3 times daily. Prescription for #90 and 1 refill sent

## 2020-07-18 NOTE — Addendum Note (Signed)
Addended by: Willow Ora E on: 07/18/2020 11:58 AM   Modules accepted: Orders

## 2020-07-31 ENCOUNTER — Other Ambulatory Visit: Payer: Self-pay | Admitting: Internal Medicine

## 2020-10-24 ENCOUNTER — Telehealth: Payer: Self-pay | Admitting: Internal Medicine

## 2020-10-24 NOTE — Telephone Encounter (Signed)
Requesting: clonazepam 0.5mg  disintegrating tab Contract: 02/16/2018 UDS: 11/10/2019 Last Visit: 05/18/2020 Next Visit: 11/10/2020 Last Refill: 07/18/2020 #90 and 1RF Pt sig: 1 tab tid prn  Please Advise

## 2020-10-25 NOTE — Telephone Encounter (Signed)
PDMP okay, Rx sent 

## 2020-11-10 ENCOUNTER — Ambulatory Visit: Payer: 59 | Admitting: Internal Medicine

## 2020-11-10 ENCOUNTER — Encounter: Payer: Self-pay | Admitting: Internal Medicine

## 2020-11-10 ENCOUNTER — Other Ambulatory Visit: Payer: Self-pay

## 2020-11-10 VITALS — BP 126/82 | HR 81 | Temp 98.0°F | Resp 16 | Ht 74.0 in | Wt 221.2 lb

## 2020-11-10 DIAGNOSIS — Z79899 Other long term (current) drug therapy: Secondary | ICD-10-CM

## 2020-11-10 DIAGNOSIS — E785 Hyperlipidemia, unspecified: Secondary | ICD-10-CM | POA: Diagnosis not present

## 2020-11-10 DIAGNOSIS — F419 Anxiety disorder, unspecified: Secondary | ICD-10-CM

## 2020-11-10 DIAGNOSIS — F32A Depression, unspecified: Secondary | ICD-10-CM | POA: Diagnosis not present

## 2020-11-10 LAB — LIPID PANEL
Cholesterol: 212 mg/dL — ABNORMAL HIGH (ref 0–200)
HDL: 34.1 mg/dL — ABNORMAL LOW (ref >39.00)
LDL Cholesterol: 149 mg/dL — ABNORMAL HIGH (ref 0–99)
NonHDL: 178.05
Total CHOL/HDL Ratio: 6
Triglycerides: 143 mg/dL (ref 0.0–149.0)
VLDL: 28.6 mg/dL (ref 0.0–40.0)

## 2020-11-10 LAB — AST: AST: 22 U/L (ref 0–37)

## 2020-11-10 LAB — ALT: ALT: 42 U/L (ref 0–53)

## 2020-11-10 NOTE — Progress Notes (Signed)
Subjective:    Patient ID: Gregory Gilmore, male    DOB: August 26, 1975, 45 y.o.   MRN: [id number]  DOS:  11/10/2020 Type of visit - description: f/u  Since the last office visit is doing well. Today with talk about anxiety, insomnia. Also d/w pt cholesterol in the context of FH CAD. The patient is a still smoking  Review of Systems See above   Past Medical History:  Diagnosis Date   Anxiety and depression    GERD (gastroesophageal reflux disease)    Hyperglycemia 07/28/2012   Hyperlipidemia 07/28/2012    Past Surgical History:  Procedure Laterality Date   NO PAST SURGERIES      Allergies as of 11/10/2020   No Known Allergies      Medication List        Accurate as of November 10, 2020  1:09 PM. If you have any questions, ask your nurse or doctor.          clonazePAM 0.5 MG disintegrating tablet Commonly known as: KLONOPIN DISSOLVE 1 TABLET IN MOUTH THREE TIMES DAILY AS NEEDED FOR ANXIETY   cyclobenzaprine 10 MG tablet Commonly known as: FLEXERIL TAKE 1 TABLET BY MOUTH AT BEDTIME AS NEEDED FOR MUSCLE SPASM   ezetimibe 10 MG tablet Commonly known as: Zetia Take 1 tablet (10 mg total) by mouth at bedtime.   omeprazole 20 MG capsule Commonly known as: PRILOSEC Take 20 mg by mouth daily.   rosuvastatin 40 MG tablet Commonly known as: Crestor Take 1 tablet (40 mg total) by mouth at bedtime.           Objective:   Physical Exam BP 126/82 (BP Location: Left Arm, Patient Position: Sitting, Cuff Size: Normal)   Pulse 81   Temp 98 F (36.7 C) (Oral)   Resp 16   Ht 6\' 2"  (1.88 m)   Wt 221 lb 4 oz (100.4 kg)   SpO2 97%   BMI 28.41 kg/m  General:   Well developed, NAD, BMI noted. HEENT:  Normocephalic . Face symmetric, atraumatic Lower extremities: no pretibial edema bilaterally  Skin: Not pale. Not jaundice Neurologic:  alert & oriented X3.  Speech normal, gait appropriate for age and unassisted Psych--  Cognition and judgment appear intact.   Cooperative with normal attention span and concentration.  Behavior appropriate. He actually looks very well today, no anxiety, no depression.     Assessment    ASSESSMENT last seen 2015, reestablish 02/18/2017 GERD Depression, GAD, panic attacks, insomnia (intolerant to Trintellix, Lexapro sertraline caused erectile dysfunction) High cholesterol CP : w/u 2014, 2015, cath 2017 @ [facility] , 2017 EGD >> was concluded sx were d/t panik  CT angio 2015: (-) + FH Sarcoidosis, CAD  PLAN Depression, GAD, panic attacks, insomnia: Intolerant to many medications, doing very well with clonazepam 3 times daily, has not reach for a psychiatrist because he talks to his pastor frequently and that helps a lot. He is aware that clonazepam would have not been my first choice and there is a potential for abuse/addiction , he verbalized understanding.  We agreed to continue same medicines, check UDS. High cholesterol: + FH, baseline LDL 190, today he reports good compliance with Crestor, Zetia, checking labs. Tobacco: Not ready to quit Preventive care: Consistently declines immunizations, he declines again today. RTC 6 months CPX    This visit occurred during the SARS-CoV-2 public health emergency.  Safety protocols were in place, including screening questions prior to the visit, additional usage of staff PPE,  and extensive cleaning of exam room while observing appropriate contact time as indicated for disinfecting solutions.

## 2020-11-10 NOTE — Patient Instructions (Addendum)
   GO TO THE LAB : Get the blood work     GO TO THE FRONT DESK, PLEASE SCHEDULE YOUR APPOINTMENTS Come back for a physical exam in 6 months 

## 2020-11-12 NOTE — Assessment & Plan Note (Signed)
Depression, GAD, panic attacks, insomnia: Intolerant to many medications, doing very well with clonazepam 3 times daily, has not reach for a psychiatrist because he talks to his pastor frequently and that helps a lot. He is aware that clonazepam would have not been my first choice and there is a potential for abuse/addiction , he verbalized understanding.  We agreed to continue same medicines, check UDS. High cholesterol: + FH, baseline LDL 190, today he reports good compliance with Crestor, Zetia, checking labs. Tobacco: Not ready to quit Preventive care: Consistently declines immunizations, he declines again today. RTC 6 months CPX

## 2020-11-13 NOTE — Addendum Note (Signed)
Addended by: Conrad [place] D on: 11/13/2020 03:46 PM   Modules accepted: Orders

## 2020-11-14 LAB — DRUG MONITORING PANEL 375977 , URINE
Alcohol Metabolites: NEGATIVE ng/mL (ref <500)
Alphahydroxyalprazolam: NEGATIVE ng/mL (ref <25)
Alphahydroxymidazolam: NEGATIVE ng/mL (ref <50)
Alphahydroxytriazolam: NEGATIVE ng/mL (ref <50)
Aminoclonazepam: 460 ng/mL — ABNORMAL HIGH (ref <25)
Amphetamines: NEGATIVE ng/mL (ref <500)
Barbiturates: NEGATIVE ng/mL (ref <300)
Benzodiazepines: POSITIVE ng/mL — AB (ref <100)
Cocaine Metabolite: NEGATIVE ng/mL (ref <150)
Desmethyltramadol: NEGATIVE ng/mL (ref <100)
Hydroxyethylflurazepam: NEGATIVE ng/mL (ref <50)
Lorazepam: NEGATIVE ng/mL (ref <50)
Marijuana Metabolite: 390 ng/mL — ABNORMAL HIGH (ref <5)
Marijuana Metabolite: POSITIVE ng/mL — AB (ref <20)
Nordiazepam: NEGATIVE ng/mL (ref <50)
Opiates: NEGATIVE ng/mL (ref <100)
Oxazepam: NEGATIVE ng/mL (ref <50)
Oxycodone: NEGATIVE ng/mL (ref <100)
Temazepam: NEGATIVE ng/mL (ref <50)
Tramadol: NEGATIVE ng/mL (ref <100)

## 2020-11-14 LAB — DM TEMPLATE

## 2020-11-18 ENCOUNTER — Encounter: Payer: Self-pay | Admitting: Internal Medicine

## 2021-01-11 ENCOUNTER — Encounter: Payer: Self-pay | Admitting: Behavioral Health

## 2021-01-11 ENCOUNTER — Other Ambulatory Visit: Payer: Self-pay

## 2021-01-11 ENCOUNTER — Ambulatory Visit: Payer: BC Managed Care – PPO | Admitting: Behavioral Health

## 2021-01-11 VITALS — BP 127/84 | HR 77 | Ht 74.0 in | Wt 230.0 lb

## 2021-01-11 DIAGNOSIS — F411 Generalized anxiety disorder: Secondary | ICD-10-CM | POA: Diagnosis not present

## 2021-01-11 DIAGNOSIS — F331 Major depressive disorder, recurrent, moderate: Secondary | ICD-10-CM | POA: Diagnosis not present

## 2021-01-11 MED ORDER — PROPRANOLOL HCL 10 MG PO TABS: 10.0000 mg | ORAL_TABLET | Freq: Three times a day (TID) | ORAL | 1 refills | Status: DC

## 2021-01-11 MED ORDER — VILAZODONE HCL 20 MG PO TABS: 20.0000 mg | ORAL_TABLET | Freq: Every day | ORAL | 1 refills | Status: DC

## 2021-01-11 NOTE — Progress Notes (Signed)
[facility] MD/PA/NP Initial Note  01/11/2021 4:22 PM Gregory Gilmore  MRN:  [id number]  Chief Complaint:  Chief Complaint   Depression; Anxiety; Establish Care; Medication Problem; Grief     HPI:   45 year old male presents to this office for initial visit and to establish care. He says that he used to be happy and full of energy but since the passing of his father 6 years ago, he has become more depressed accompanied with anxiety and panic attacks. Dr. Larose Gilmore, his PCP was treating him before referral. He says that he just cannot get better. He has attempted several other SSRI's in which he says was ineffective or had sexual side effects. He has regular panic attacks in which he says he has ruled out cardiac issues. He has had full cardiac workup with a cardiac cath that was normal. Says his panic attacks last roughly 20 minutes. Says his heart races, palpitations, and trouble breathing. "Feels like I'm having a heart attack". He is wanting to try a new medication to see if it helps. He realizes that Klonopin is not fixing the problem. Says that he just wanted to bury what he feels and not talk about it. He is open to psychotherapy. Says his anxiety today is 6/10 and depression is 5/10. He is sleeping 7-8 hours every night. He often wakes up in the middle of night to look at clock. He denies mania, no psychosis, no SI/HI.   Past psychiatric medication trials: Lexapro Zoloft Celexa Wellbutrin Trintellix Klonopin    Visit Diagnosis:    ICD-10-CM   1. Generalized anxiety disorder  F41.1 Vilazodone HCl (VIIBRYD) 20 MG TABS    propranolol (INDERAL) 10 MG tablet    2. Major depressive disorder, recurrent episode, moderate (HCC)  F33.1 Vilazodone HCl (VIIBRYD) 20 MG TABS      Past Psychiatric History: anxiety, depression, grief  Past Medical History:  Past Medical History:  Diagnosis Date   Anxiety and depression    GERD (gastroesophageal reflux disease)    Hyperglycemia 07/28/2012    Hyperlipidemia 07/28/2012    Past Surgical History:  Procedure Laterality Date   NO PAST SURGERIES      Family Psychiatric History: see chart   Family History:  Family History  Problem Relation Age of Onset   CAD Father        cabg 68 y/o   Diabetes Father    Sarcoidosis Mother    Sarcoidosis Maternal Uncle    Colon cancer Neg Hx    Prostate cancer Neg Hx     Social History:  Social History   Socioeconomic History   Marital status: Married    Spouse name: Not on file   Number of children: 2   Years of education: 12   Highest education level: High school graduate  Occupational History   Occupation: Librarian, academic    Occupation: Administrator, [employer]  Tobacco Use   Smoking status: Every Day   Smokeless tobacco: Never   Tobacco comments:    1 ppd   Vaping Use   Vaping Use: Some days   Substances: Nicotine  Substance and Sexual Activity   Alcohol use: No   Drug use: No   Sexual activity: Yes    Comment: married  Other Topics Concern   Not on file  Social History Narrative   Original from [place], father was Gregory Gilmore a pt of mine deceased ~ 2012/02/23   Married, 2 children (one from wife)   finished  HS, sales    Social Determinants of Health   Financial Resource Strain: Not on file  Food Insecurity: Not on file  Transportation Needs: Not on file  Physical Activity: Not on file  Stress: Not on file  Social Connections: Not on file    Allergies: No Known Allergies  Metabolic Disorder Labs: Lab Results  Component Value Date   HGBA1C 5.6 05/31/2019   No results found for: PROLACTIN Lab Results  Component Value Date   CHOL 212 (H) 11/10/2020   TRIG 143.0 11/10/2020   HDL 34.10 (L) 11/10/2020   CHOLHDL 6 11/10/2020   VLDL 28.6 11/10/2020   LDLCALC 149 (H) 11/10/2020   LDLCALC 176 (H) 05/18/2020   Lab Results  Component Value Date   TSH 0.93 05/18/2020   TSH 1.28 11/12/2018    Therapeutic Level Labs: No results found for: LITHIUM No  results found for: VALPROATE No components found for:  CBMZ  Current Medications: Current Outpatient Medications  Medication Sig Dispense Refill   clonazePAM (KLONOPIN) 0.5 MG disintegrating tablet DISSOLVE 1 TABLET IN MOUTH THREE TIMES DAILY AS NEEDED FOR ANXIETY 90 tablet 1   cyclobenzaprine (FLEXERIL) 10 MG tablet TAKE 1 TABLET BY MOUTH AT BEDTIME AS NEEDED FOR MUSCLE SPASM 30 tablet 1   ezetimibe (ZETIA) 10 MG tablet Take 1 tablet (10 mg total) by mouth at bedtime. 90 tablet 1   propranolol (INDERAL) 10 MG tablet Take 1 tablet (10 mg total) by mouth 3 (three) times daily. 90 tablet 1   rosuvastatin (CRESTOR) 40 MG tablet Take 1 tablet (40 mg total) by mouth at bedtime. 90 tablet 1   Vilazodone HCl (VIIBRYD) 20 MG TABS Take 1 tablet (20 mg total) by mouth daily. 30 tablet 1   No current facility-administered medications for this visit.    Medication Side Effects: none  Orders placed this visit:  No orders of the defined types were placed in this encounter.   Psychiatric Specialty Exam:  Review of Systems  Constitutional: Negative.   Allergic/Immunologic: Negative.   Neurological:  Positive for weakness.  Psychiatric/Behavioral:  Positive for dysphoric mood. The patient is nervous/anxious.    There were no vitals taken for this visit.There is no height or weight on file to calculate BMI.  General Appearance: Casual and Neat  Eye Contact:  Good  Speech:  Clear and Coherent  Volume:  Normal  Mood:  Anxious and Dysphoric  Affect:  Depressed and Anxious  Thought Process:  Coherent  Orientation:  Full (Time, Place, and Person)  Thought Content: Logical   Suicidal Thoughts:  No  Homicidal Thoughts:  No  Memory:  WNL  Judgement:  Good  Insight:  Fair  Psychomotor Activity:  Normal  Concentration:  Concentration: Good  Recall:  Good  Fund of Knowledge: Fair  Language: Good  Assets:  Desire for Improvement  ADL's:  Intact  Cognition: WNL  Prognosis:  Good   Screenings:   GAD-7    Flowsheet Row Office Visit from 01/11/2021 in [facility]  Total GAD-7 Score 17      PHQ2-9    [place] Office Visit from 01/11/2021 in [facility] Visit from 11/10/2020 in [facility] at [place] Visit from 05/18/2020 in [facility] at [facility] [place] Visit from 11/10/2019 in [place] [facility] at [place] Visit from 05/14/2019 in [facility] at [facility] [place]  PHQ-2 Total Score 6 2  4 4 3   PHQ-9 Total Score 9 7 16 8 8        Receiving Psychotherapy: No   Treatment Plan/Recommendations:   Greater than 50% of  60 min face to face time with patient was spent on counseling and coordination of care. We discussed his hx of anxiety and depression with panic attacks that progressively have become worse since the passing of his father 6 years ago. Discussed his past tx, medications, and current medications. Reviewed medication alternatives and possible side effects. We agreed to; To start Viibryd 10 mg for 7 days, then 20 mg tablet daily. To start Inderal 10 mg three times daily for anxiety. If effective he will attempt to use less Klonopin.  Continue Klonopin three times daily prn  Will report worsening symptoms promptly Provided emergency contact information Reviewed PDMP    , NP

## 2021-01-24 ENCOUNTER — Other Ambulatory Visit (INDEPENDENT_AMBULATORY_CARE_PROVIDER_SITE_OTHER): Payer: BC Managed Care – PPO

## 2021-01-24 DIAGNOSIS — E785 Hyperlipidemia, unspecified: Secondary | ICD-10-CM

## 2021-01-24 LAB — LIPID PANEL
Cholesterol: 115 mg/dL (ref 0–200)
HDL: 36.1 mg/dL — ABNORMAL LOW (ref >39.00)
LDL Cholesterol: 60 mg/dL (ref 0–99)
NonHDL: 79.04
Total CHOL/HDL Ratio: 3
Triglycerides: 97 mg/dL (ref 0.0–149.0)
VLDL: 19.4 mg/dL (ref 0.0–40.0)

## 2021-01-24 LAB — ALT: ALT: 32 U/L (ref 0–53)

## 2021-01-24 LAB — AST: AST: 21 U/L (ref 0–37)

## 2021-01-26 MED ORDER — EZETIMIBE 10 MG PO TABS: 10.0000 mg | ORAL_TABLET | Freq: Every day | ORAL | 3 refills | Status: DC

## 2021-01-26 MED ORDER — ROSUVASTATIN CALCIUM 40 MG PO TABS: 40.0000 mg | ORAL_TABLET | Freq: Every day | ORAL | 3 refills | Status: DC

## 2021-01-26 NOTE — Addendum Note (Signed)
Addended byConrad Lakeview D on: 01/26/2021 12:55 PM   Modules accepted: Orders

## 2021-02-06 ENCOUNTER — Telehealth: Payer: Self-pay | Admitting: Internal Medicine

## 2021-02-06 MED ORDER — CLONAZEPAM 0.5 MG PO TBDP: ORAL_TABLET | ORAL | 1 refills | Status: DC

## 2021-02-06 NOTE — Telephone Encounter (Signed)
Pt would like all his refills to be sent to this pharmacy from now on.   Medication: clonazePAM (KLONOPIN) 0.5 MG disintegrating tablet  Has the patient contacted their pharmacy? Yes.   (If no, request that the patient contact the pharmacy for the refill.) (If yes, when and what did the pharmacy advise?)  Preferred Pharmacy (with phone number or street name):  [facility]  [address] [place], [place] [address] Phone: [phone]   Agent: Please be advised that RX refills may take up to 3 business days. We ask that you follow-up with your pharmacy.

## 2021-02-06 NOTE — Telephone Encounter (Signed)
Requesting: clonazepam 0.5mg  Contract: 02/16/2018 UDS: 11/10/2020 Last Visit: 11/10/2020 Next Visit: 05/11/2021 Last Refill: 10/25/2020 #90 and 1RF Pt sig: 1 tab tid prn  Please Advise

## 2021-02-06 NOTE — Telephone Encounter (Signed)
PDMP okay, Rx sent 

## 2021-02-07 ENCOUNTER — Telehealth: Payer: Self-pay

## 2021-02-07 ENCOUNTER — Telehealth: Payer: Self-pay | Admitting: Internal Medicine

## 2021-02-07 MED ORDER — CYCLOBENZAPRINE HCL 10 MG PO TABS: ORAL_TABLET | ORAL | 1 refills | Status: DC

## 2021-02-07 NOTE — Telephone Encounter (Signed)
Rx sent 

## 2021-02-07 NOTE — Telephone Encounter (Signed)
Medication: cyclobenzaprine (FLEXERIL) 10 MG tablet   Has the patient contacted their pharmacy? Yes.   (If no, request that the patient contact the pharmacy for the refill.) (If yes, when and what did the pharmacy advise?)  Preferred Pharmacy (with phone number or street name): [facility] [phone] [place], [place] [phone] [facility] [address] AT [facility] [address]  [address] [place] [address]  Phone:  [phone]  Fax:  [phone]   Agent: Please be advised that RX refills may take up to 3 business days. We ask that you follow-up with your pharmacy.

## 2021-02-07 NOTE — Telephone Encounter (Signed)
PA initiated via Covermymeds; KEY: [id number]. Awaiting determination

## 2021-02-09 NOTE — Telephone Encounter (Signed)
Additional information request, form completed and faxed back to [facility] at [phone]. Awaiting determination.

## 2021-02-12 ENCOUNTER — Telehealth: Payer: Self-pay | Admitting: Internal Medicine

## 2021-02-12 MED ORDER — CLONAZEPAM 0.5 MG PO TABS: 0.5000 mg | ORAL_TABLET | Freq: Three times a day (TID) | ORAL | 3 refills | Status: DC | PRN

## 2021-02-12 MED ORDER — ROSUVASTATIN CALCIUM 40 MG PO TABS: 40.0000 mg | ORAL_TABLET | Freq: Every day | ORAL | 3 refills | Status: DC

## 2021-02-12 NOTE — Telephone Encounter (Signed)
LMOM informing his insurance is no longer covering the ODT clonazepam because he doesn't have a medical reason (such as stroke, etc) that he can not swallow the clonazepam tablets. Informed PCP changed the dose type to regular tablets for him- if he prefers he can pay OOP for ODT clonazepam to let us know his choice. Rx re-sent for Crestor.

## 2021-02-12 NOTE — Telephone Encounter (Signed)
PA denied. Pt is able to swallow solid dosage forms and Pt must have tried and failed ALL other alternative medications: alprazolam 0.5mg  orally disintegrating tablets, clonazepam tablets, diazepam tablets, etc.

## 2021-02-12 NOTE — Telephone Encounter (Signed)
PA denied. Awaiting denial information.  °

## 2021-02-12 NOTE — Telephone Encounter (Signed)
Appeals must be completed in writing and faxed to [facility] [place] Appeals Dept Level 1 at [phone].

## 2021-02-12 NOTE — Telephone Encounter (Signed)
Pt stated pharmacy gave him klonopin pills, but he needed the dissolvable kind. Also received 3 bottles of zetia, but no crestor.   Medication: clonazePAM (KLONOPIN) 0.5 MG tablet   dissolvable  rosuvastatin (CRESTOR) 40 MG tablet         Has the patient contacted their pharmacy? Yes.     Preferred Pharmacy: [facility] [place], [place] - [address] AT [address]  [place], [place] [address]  Phone:  [phone]  Fax:  [phone]

## 2021-02-12 NOTE — Telephone Encounter (Signed)
Will change to tablets.

## 2021-02-16 ENCOUNTER — Ambulatory Visit (INDEPENDENT_AMBULATORY_CARE_PROVIDER_SITE_OTHER): Payer: BC Managed Care – PPO | Admitting: Behavioral Health

## 2021-02-16 ENCOUNTER — Other Ambulatory Visit: Payer: Self-pay

## 2021-02-16 ENCOUNTER — Encounter: Payer: Self-pay | Admitting: Behavioral Health

## 2021-02-16 DIAGNOSIS — F411 Generalized anxiety disorder: Secondary | ICD-10-CM

## 2021-02-16 DIAGNOSIS — F331 Major depressive disorder, recurrent, moderate: Secondary | ICD-10-CM | POA: Diagnosis not present

## 2021-02-16 MED ORDER — PROPRANOLOL HCL 20 MG PO TABS: 20.0000 mg | ORAL_TABLET | Freq: Three times a day (TID) | ORAL | 2 refills | Status: DC

## 2021-02-16 MED ORDER — VILAZODONE HCL 40 MG PO TABS: 40.0000 mg | ORAL_TABLET | Freq: Every day | ORAL | 2 refills | Status: DC

## 2021-02-16 NOTE — Progress Notes (Signed)
[facility] Med Check  Patient ID: Gregory Gilmore,  MRN: [id number]  PCP: Colon Branch, MD  Date of Evaluation: 02/16/2021 Time spent:30 minutes  Chief Complaint:  Chief Complaint   Anxiety; Depression; Follow-up; Medication Refill; Medication Problem     HISTORY/CURRENT STATUS: HPI  46 year old male presents to this office for initial visit and to establish care. Says that he has not noticed much effect from Viibryd and Inderal helping with anxiety and depression. Says that he would like to consider a dosage increase at this time to see if it will help. Continue to complain of poor energy levels. He says he does not eat breakfast or lunch and lives off [employer] and cigarettes during the day. He wonders about testosterone levels. Says his anxiety today is 5/10 and depression is 4/10. He is sleeping 7-8 hours every night. He often wakes up in the middle of night to look at clock. He denies mania, no psychosis, no SI/HI.    Past psychiatric medication trials: Lexapro Zoloft Celexa Wellbutrin Trintellix Klonopin     Individual Medical History/ Review of Systems: Changes? :No   Allergies: Patient has no known allergies.  Current Medications:  Current Outpatient Medications:    propranolol (INDERAL) 20 MG tablet, Take 1 tablet (20 mg total) by mouth 3 (three) times daily., Disp: 90 tablet, Rfl: 2   Vilazodone HCl (VIIBRYD) 40 MG TABS, Take 1 tablet (40 mg total) by mouth daily., Disp: 30 tablet, Rfl: 2   clonazePAM (KLONOPIN) 0.5 MG tablet, Take 1 tablet (0.5 mg total) by mouth 3 (three) times daily as needed for anxiety., Disp: 90 tablet, Rfl: 3   cyclobenzaprine (FLEXERIL) 10 MG tablet, TAKE 1 TABLET BY MOUTH AT BEDTIME AS NEEDED FOR MUSCLE SPASM, Disp: 30 tablet, Rfl: 1   ezetimibe (ZETIA) 10 MG tablet, Take 1 tablet (10 mg total) by mouth at bedtime., Disp: 90 tablet, Rfl: 3   propranolol (INDERAL) 10 MG tablet, Take 1 tablet (10 mg total) by mouth 3 (three) times daily.,  Disp: 90 tablet, Rfl: 1   rosuvastatin (CRESTOR) 40 MG tablet, Take 1 tablet (40 mg total) by mouth at bedtime., Disp: 90 tablet, Rfl: 3   Vilazodone HCl (VIIBRYD) 20 MG TABS, Take 1 tablet (20 mg total) by mouth daily., Disp: 30 tablet, Rfl: 1 Medication Side Effects: none  Family Medical/ Social History: Changes? No  MENTAL HEALTH EXAM:  There were no vitals taken for this visit.There is no height or weight on file to calculate BMI.  General Appearance: Casual and Neat  Eye Contact:  Good  Speech:  Clear and Coherent  Volume:  Normal  Mood:  NA  Affect:  Depressed and Anxious  Thought Process:  Coherent  Orientation:  Full (Time, Place, and Person)  Thought Content: Logical   Suicidal Thoughts:  No  Homicidal Thoughts:  No  Memory:  WNL  Judgement:  Good  Insight:  Good  Psychomotor Activity:  Normal  Concentration:  Concentration: Good  Recall:  Good  Fund of Knowledge: Good  Language: Good  Assets:  Desire for Improvement  ADL's:  Intact  Cognition: WNL  Prognosis:  Good    DIAGNOSES:    ICD-10-CM   1. Generalized anxiety disorder  F41.1 Vilazodone HCl (VIIBRYD) 40 MG TABS    propranolol (INDERAL) 20 MG tablet    2. Major depressive disorder, recurrent episode, moderate (HCC)  F33.1 Vilazodone HCl (VIIBRYD) 40 MG TABS      Receiving Psychotherapy: No  RECOMMENDATIONS:   Greater than 50% of 30 min face to face time with patient was spent on counseling and coordination of care.Discussed his minimal improvement with anxiety and depression since last visit. Discussed his past tx, medications, and current medications. Reviewed medication alternatives and possible side effects. I educated pt on proper nutrition and how skipping breakfast and lunch working a physical job was not going to help his energy levels. I also discussed Testosterone levels, but he did not want to do labs at this time. We agreed to; Increase Viibryd to 40 mg daily Increase  Inderal to 20 mg  three times daily for anxiety. If effective he will attempt to use less Klonopin.  Continue Klonopin three times daily prn  Will report worsening symptoms promptly Provided emergency contact information Reviewed Adrian, NP

## 2021-04-05 ENCOUNTER — Telehealth: Payer: Self-pay | Admitting: Internal Medicine

## 2021-04-05 MED ORDER — CYCLOBENZAPRINE HCL 10 MG PO TABS: ORAL_TABLET | ORAL | 1 refills | Status: DC

## 2021-04-05 NOTE — Telephone Encounter (Signed)
Rx sent 

## 2021-04-05 NOTE — Telephone Encounter (Signed)
Medication:  ?1.cyclobenzaprine (FLEXERIL) 10 MG tablet ? ?Has the patient contacted their pharmacy? Yes.   ? ?Preferred Pharmacy (with phone number or street name):  ?[facility] [phone] [place], [place] AT [place]  ?[address] [place] [address]  ?Phone:  [phone]  Fax:  [phone]  ? ?Agent: Please be advised that RX refills may take up to 3 business days. We ask that you follow-up with your pharmacy.  ?

## 2021-04-27 ENCOUNTER — Ambulatory Visit: Payer: BC Managed Care – PPO | Admitting: Behavioral Health

## 2021-04-27 ENCOUNTER — Other Ambulatory Visit: Payer: Self-pay

## 2021-04-27 ENCOUNTER — Telehealth: Payer: Self-pay | Admitting: Behavioral Health

## 2021-04-27 DIAGNOSIS — F331 Major depressive disorder, recurrent, moderate: Secondary | ICD-10-CM

## 2021-04-27 DIAGNOSIS — F411 Generalized anxiety disorder: Secondary | ICD-10-CM

## 2021-04-27 MED ORDER — PROPRANOLOL HCL 20 MG PO TABS: 20.0000 mg | ORAL_TABLET | Freq: Three times a day (TID) | ORAL | 0 refills | Status: DC

## 2021-04-27 MED ORDER — VILAZODONE HCL 40 MG PO TABS: 40.0000 mg | ORAL_TABLET | Freq: Every day | ORAL | 0 refills | Status: DC

## 2021-04-27 NOTE — Telephone Encounter (Signed)
Pt called at 9:10 am . He needs refills on his viibryd 40 mg and propranolol 20 mg. Pharmacy is [facility] in [place]. His next appt is 4/14.He had an appt today but we had to cancel due to brian being out of the office. ?

## 2021-04-27 NOTE — Telephone Encounter (Signed)
Rx sent 

## 2021-05-11 ENCOUNTER — Encounter: Payer: 59 | Admitting: Internal Medicine

## 2021-05-18 ENCOUNTER — Ambulatory Visit: Payer: BC Managed Care – PPO | Admitting: Behavioral Health

## 2021-05-18 ENCOUNTER — Encounter: Payer: Self-pay | Admitting: Behavioral Health

## 2021-05-18 DIAGNOSIS — F331 Major depressive disorder, recurrent, moderate: Secondary | ICD-10-CM | POA: Diagnosis not present

## 2021-05-18 DIAGNOSIS — F333 Major depressive disorder, recurrent, severe with psychotic symptoms: Secondary | ICD-10-CM | POA: Diagnosis not present

## 2021-05-18 DIAGNOSIS — F329 Major depressive disorder, single episode, unspecified: Secondary | ICD-10-CM

## 2021-05-18 DIAGNOSIS — F411 Generalized anxiety disorder: Secondary | ICD-10-CM

## 2021-05-18 MED ORDER — PROPRANOLOL HCL 20 MG PO TABS: 20.0000 mg | ORAL_TABLET | Freq: Three times a day (TID) | ORAL | 3 refills | Status: DC

## 2021-05-18 MED ORDER — VILAZODONE HCL 40 MG PO TABS: 40.0000 mg | ORAL_TABLET | Freq: Every day | ORAL | 3 refills | Status: DC

## 2021-05-18 MED ORDER — CARIPRAZINE HCL 1.5 MG PO CAPS: 1.5000 mg | ORAL_CAPSULE | Freq: Every day | ORAL | 1 refills | Status: DC

## 2021-05-18 NOTE — Progress Notes (Signed)
[facility] ? ?Patient ID: Gregory Gilmore,  ?MRN: [id number] ? ?PCP: Wanda Plump, MD ? ?Date of Evaluation: 05/18/2021 ?Time spent:30 minutes ? ?Chief Complaint:  ? ?HISTORY/CURRENT STATUS: ?HPI ? ? ?46 year old male presents to this office for follow up and medication management. Says that his anxiety has much improved and is 0/10. He says his depression is still severe 7/10 and feels like Viibryd is not helping. Says that he would like to consider a dosage increase or another medication at this time to help.  Continue to complain of poor energy levels, lack of motivation, and anhedonia.  He says he does not eat breakfast or lunch and lives off [employer] and cigarettes during the day. He wonders about testosterone levels. Says his anxiety today is 0/10 and depression is 7/10. He is sleeping 7-8 hours every night. He often wakes up in the middle of night to look at clock. He denies mania, no psychosis, no SI/HI.  ?  ?Past psychiatric medication trials: ?Lexapro ?Zoloft ?Celexa ?Wellbutrin ?Trintellix ?Klonopin ? ?Individual Medical History/ Review of Systems: Changes? :No  ? ?Allergies: Patient has no known allergies. ? ?Current Medications:  ?Current Outpatient Medications:  ?  cariprazine (VRAYLAR) 1.5 MG capsule, Take 1 capsule (1.5 mg total) by mouth daily., Disp: 30 capsule, Rfl: 1 ?  clonazePAM (KLONOPIN) 0.5 MG tablet, Take 1 tablet (0.5 mg total) by mouth 3 (three) times daily as needed for anxiety., Disp: 90 tablet, Rfl: 3 ?  cyclobenzaprine (FLEXERIL) 10 MG tablet, TAKE 1 TABLET BY MOUTH AT BEDTIME AS NEEDED FOR MUSCLE SPASM, Disp: 30 tablet, Rfl: 1 ?  ezetimibe (ZETIA) 10 MG tablet, Take 1 tablet (10 mg total) by mouth at bedtime., Disp: 90 tablet, Rfl: 3 ?  propranolol (INDERAL) 20 MG tablet, Take 1 tablet (20 mg total) by mouth 3 (three) times daily., Disp: 90 tablet, Rfl: 3 ?  rosuvastatin (CRESTOR) 40 MG tablet, Take 1 tablet (40 mg total) by mouth at bedtime., Disp: 90 tablet, Rfl: 3 ?   Vilazodone HCl (VIIBRYD) 40 MG TABS, Take 1 tablet (40 mg total) by mouth daily., Disp: 30 tablet, Rfl: 3 ?Medication Side Effects: none ? ?Family Medical/ Social History: Changes? No ? ?MENTAL HEALTH EXAM: ? ?There were no vitals taken for this visit.There is no height or weight on file to calculate BMI.  ?General Appearance: Casual, Neat, and Well Groomed  ?Eye Contact:  Fair  ?Speech:  Clear and Coherent and Talkative  ?Volume:  Normal  ?Mood:  Anxious and Depressed  ?Affect:  Congruent, Depressed, and Anxious  ?Thought Process:  Coherent  ?Orientation:  Full (Time, Place, and Person)  ?Thought Content: Logical   ?Suicidal Thoughts:  No  ?Homicidal Thoughts:  No  ?Memory:  WNL  ?Judgement:  Fair  ?Insight:  Fair  ?Psychomotor Activity:  Normal  ?Concentration:  Concentration: Fair  ?Recall:  Fair  ?Fund of Knowledge: Good  ?Language: Fair  ?Assets:  Desire for Improvement  ?ADL's:  Intact  ?Cognition: WNL  ?Prognosis:  Good  ? ? ?DIAGNOSES:  ?  ICD-10-CM   ?1. Generalized anxiety disorder  F41.1 Vilazodone HCl (VIIBRYD) 40 MG TABS  ?  propranolol (INDERAL) 20 MG tablet  ?  cariprazine (VRAYLAR) 1.5 MG capsule  ?  DISCONTINUED: cariprazine (VRAYLAR) 1.5 MG capsule  ?  DISCONTINUED: propranolol (INDERAL) 20 MG tablet  ?  DISCONTINUED: Vilazodone HCl (VIIBRYD) 40 MG TABS  ?  ?2. Depression resistant to treatment  F32.9 cariprazine (VRAYLAR) 1.5  MG capsule  ?  DISCONTINUED: cariprazine (VRAYLAR) 1.5 MG capsule  ?  ?3. Severe episode of recurrent major depressive disorder, with psychotic features (HCC)  F33.3 cariprazine (VRAYLAR) 1.5 MG capsule  ?  DISCONTINUED: cariprazine (VRAYLAR) 1.5 MG capsule  ?  ?4. Major depressive disorder, recurrent episode, moderate (HCC)  F33.1 Vilazodone HCl (VIIBRYD) 40 MG TABS  ?  DISCONTINUED: Vilazodone HCl (VIIBRYD) 40 MG TABS  ?  ? ? ?Receiving Psychotherapy: No  ? ? ?RECOMMENDATIONS:  ? ?Greater than 50% of 30 min face to face time with patient was spent on counseling and  coordination of care.Discussed his minimal improvement with anxiety and depression since last visit. Discussed his past tx, medications, and current medications. Reviewed medication alternatives and possible side effects. I educated pt on proper nutrition and how skipping breakfast and lunch working a physical job was not going to help his energy levels. I also discussed Testosterone levels, but he did not want to do labs at this time. We agreed to; ?Continue  Viibryd to 40 mg daily ?Continue   Inderal to 20 mg three times daily for anxiety. If effective  ?He will attempt to use less Klonopin.  ?To start Vraylar 1.5 mg daily ?Continue Klonopin three times daily prn  ?Will report worsening symptoms promptly ?Provided emergency contact information ?Discussed potential metabolic side effects associated with atypical antipsychotics, as well as potential risk for movement side effects. Advised pt to contact office if movement side effects occur.   ?Reviewed PDMP ? ? ? ? ? ?Joan Flores, NP  ?

## 2021-05-22 IMAGING — DX DG CHEST 2V
2 series · 2 of 2 positions shown · non-contrast
Comparison: Radiograph August 02, 2014

CLINICAL DATA: Chest pain, weight loss, history of smoking

EXAM:
CHEST - 2 VIEW

[chest pa]
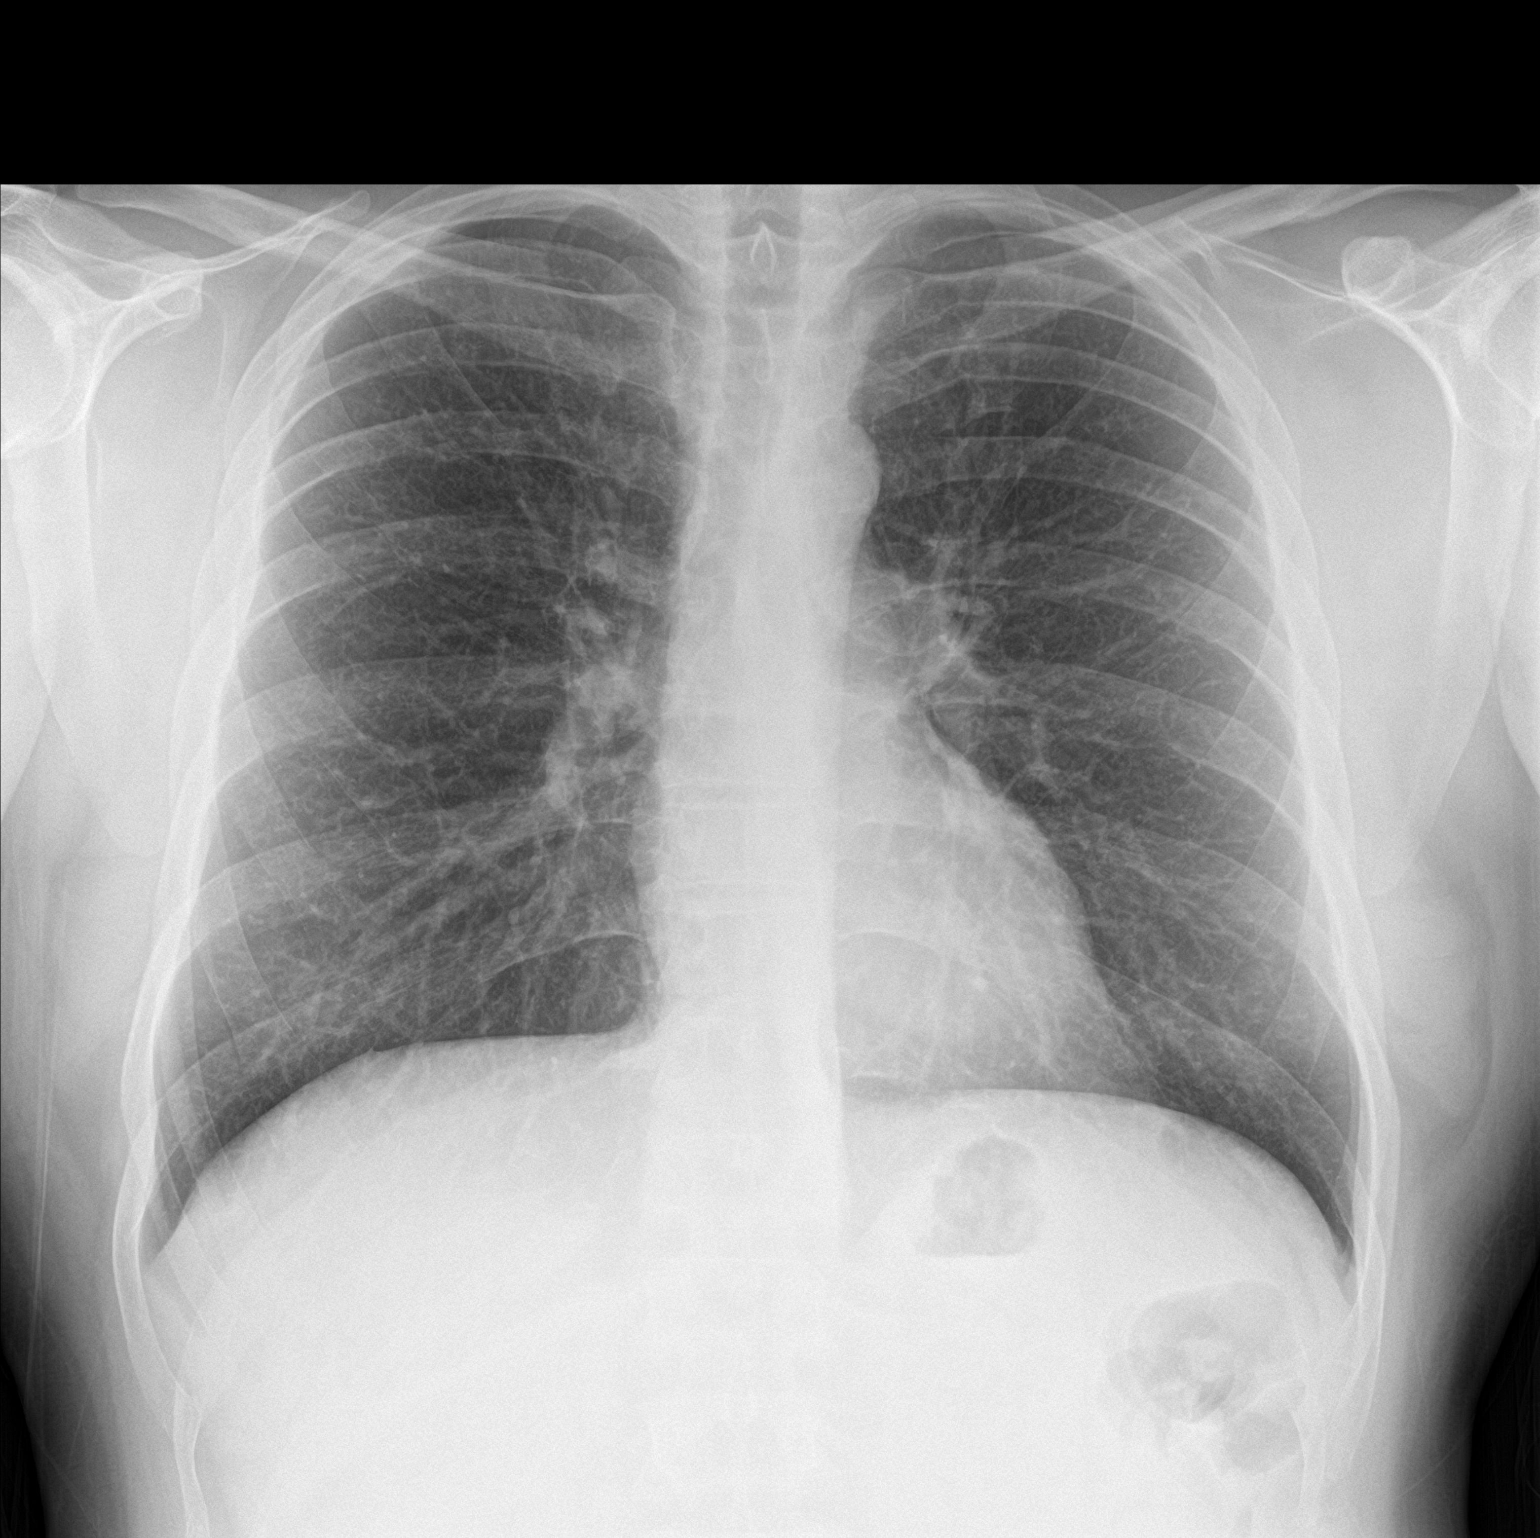

[chest lat]
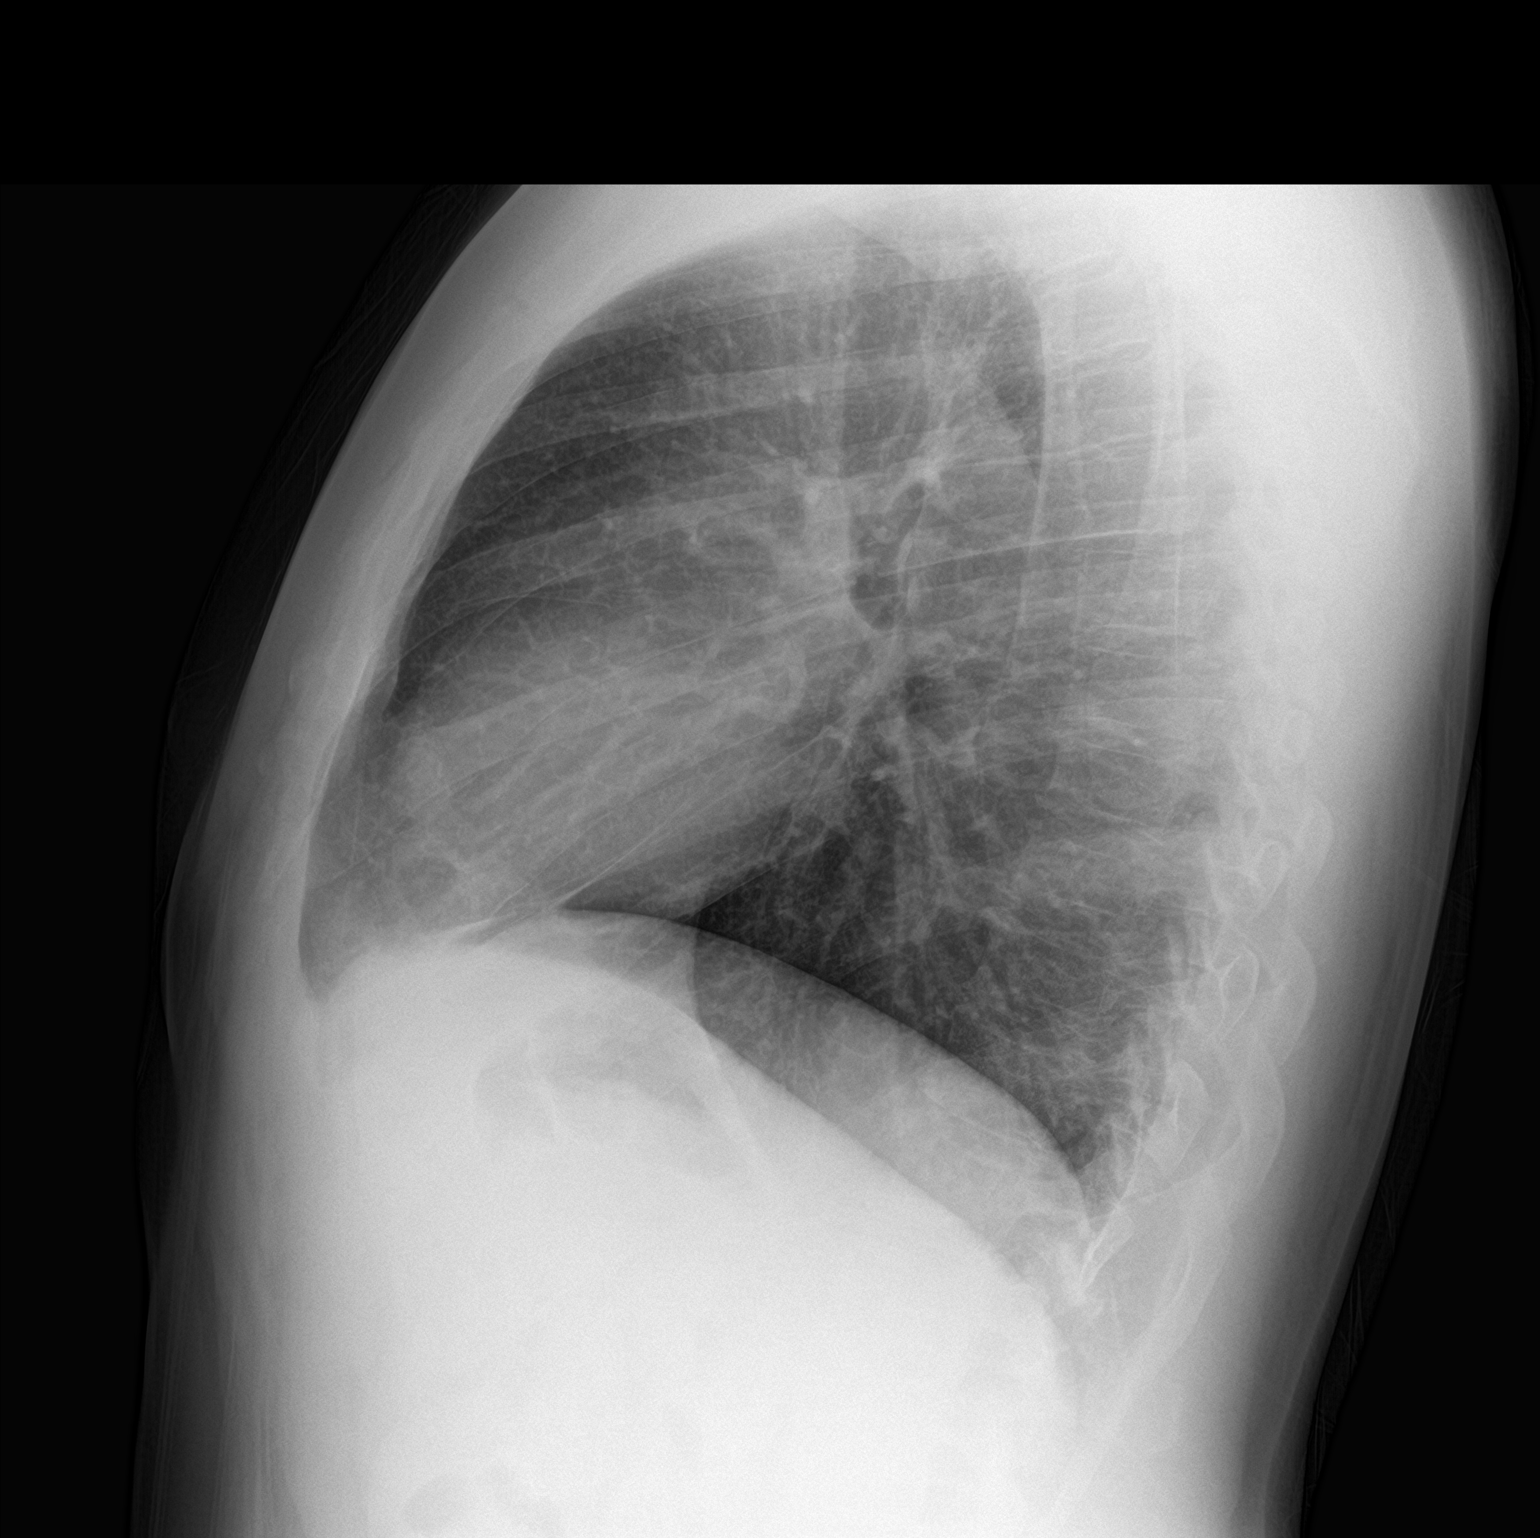

[2 of 2 positions shown; findings below may reference images not displayed]

FINDINGS: Chronically coarsened interstitial changes. No consolidation,
features of edema, pneumothorax, or effusion. Pulmonary vascularity
is normally distributed. The cardiomediastinal contours are
unremarkable. No acute osseous or soft tissue abnormality.
IMPRESSION: Coarsened interstitial changes, possibly smoking related.

No acute cardiopulmonary abnormality.

## 2021-05-23 ENCOUNTER — Telehealth: Payer: Self-pay

## 2021-05-23 NOTE — Telephone Encounter (Signed)
Prior Authorization submitted and approved for VRAYLAR 1.5 MG effective 05/23/2021-05/22/2022 with [employer] ID# [id number] ?

## 2021-06-04 ENCOUNTER — Telehealth: Payer: Self-pay | Admitting: Internal Medicine

## 2021-06-04 MED ORDER — CYCLOBENZAPRINE HCL 10 MG PO TABS: ORAL_TABLET | ORAL | 1 refills | Status: DC

## 2021-06-04 MED ORDER — CLONAZEPAM 0.5 MG PO TABS: 0.5000 mg | ORAL_TABLET | Freq: Three times a day (TID) | ORAL | 1 refills | Status: DC | PRN

## 2021-06-04 NOTE — Telephone Encounter (Signed)
Requesting: clonazepam 0.5mg   ?Contract: 02/16/2018 ?UDS: 11/10/20 ?Last Visit: 11/10/20 ?Next Visit: 06/08/21 ?Last Refill: 02/12/21 #90 and 3RF ?Pt sig: 1 tab tid prn ? ?Please Advise ? ?

## 2021-06-04 NOTE — Telephone Encounter (Signed)
PDMP okay, he has an appointment with me soon.  Prescription sent ?

## 2021-06-04 NOTE — Telephone Encounter (Signed)
Patient needs medications refilled  ? ?Cyclobenzaprine and clonazepam  ? ?[facility] [address] [phone] ?

## 2021-06-08 ENCOUNTER — Encounter: Payer: Self-pay | Admitting: Internal Medicine

## 2021-06-08 ENCOUNTER — Ambulatory Visit (INDEPENDENT_AMBULATORY_CARE_PROVIDER_SITE_OTHER): Payer: BC Managed Care – PPO | Admitting: Internal Medicine

## 2021-06-08 ENCOUNTER — Ambulatory Visit: Payer: BC Managed Care – PPO | Admitting: Behavioral Health

## 2021-06-08 VITALS — BP 124/72 | HR 70 | Temp 98.6°F | Resp 16 | Ht 74.0 in | Wt 225.1 lb

## 2021-06-08 DIAGNOSIS — Z0189 Encounter for other specified special examinations: Secondary | ICD-10-CM

## 2021-06-08 DIAGNOSIS — Z0000 Encounter for general adult medical examination without abnormal findings: Secondary | ICD-10-CM | POA: Diagnosis not present

## 2021-06-08 DIAGNOSIS — E785 Hyperlipidemia, unspecified: Secondary | ICD-10-CM

## 2021-06-08 DIAGNOSIS — F419 Anxiety disorder, unspecified: Secondary | ICD-10-CM

## 2021-06-08 DIAGNOSIS — Z0001 Encounter for general adult medical examination with abnormal findings: Secondary | ICD-10-CM | POA: Diagnosis not present

## 2021-06-08 DIAGNOSIS — G4700 Insomnia, unspecified: Secondary | ICD-10-CM | POA: Diagnosis not present

## 2021-06-08 DIAGNOSIS — R739 Hyperglycemia, unspecified: Secondary | ICD-10-CM

## 2021-06-08 DIAGNOSIS — Z79899 Other long term (current) drug therapy: Secondary | ICD-10-CM | POA: Diagnosis not present

## 2021-06-08 DIAGNOSIS — F32A Anxiety disorder, unspecified: Secondary | ICD-10-CM

## 2021-06-08 NOTE — Progress Notes (Signed)
? ?Subjective:  ? ? Patient ID: Gregory Gilmore, male    DOB: December 22, 1975, 46 y.o.   MRN: [id number] ? ?DOS:  06/08/2021 ?Type of visit - description: cpx ? ?Here for CPX ?We also assessed his chronic medical problems. ?Anxiety, depression: Now under the care of of psychiatry. ?Symptoms are not necessarily better. ?He also reports feeling tired, + snoring,  reports broken sleep for years.    ?Denies any suicidal or aggressive thoughts ? ?Review of Systems ? ?Other than above, a 14 point review of systems is negative  ? ? ?Past Medical History:  ?Diagnosis Date  ? Anxiety and depression   ? GERD (gastroesophageal reflux disease)   ? Hyperglycemia 07/28/2012  ? Hyperlipidemia 07/28/2012  ? ? ?Past Surgical History:  ?Procedure Laterality Date  ? NO PAST SURGERIES    ? ?Social History  ? ?Socioeconomic History  ? Marital status: Married  ?  Spouse name: Not on file  ? Number of children: 2  ? Years of education: 60  ? Highest education level: High school graduate  ?Occupational History  ? Occupation: Camera operator   ? Occupation: Corporate treasurer company  ?Tobacco Use  ? Smoking status: Every Day  ? Smokeless tobacco: Never  ? Tobacco comments:  ?  1 ppd   ?Vaping Use  ? Vaping Use: Some days  ? Substances: Nicotine  ?Substance and Sexual Activity  ? Alcohol use: No  ? Drug use: No  ? Sexual activity: Yes  ?  Comment: married  ?Other Topics Concern  ? Not on file  ?Social History Narrative  ? Original from [place], father was Rhett Mutschler a pt of mine deceased ~ 26-Feb-2012  ? Married, 2 children (one from wife)  ? finished HS, sales   ? ?Social Determinants of Health  ? ?Financial Resource Strain: Not on file  ?Food Insecurity: Not on file  ?Transportation Needs: Not on file  ?Physical Activity: Not on file  ?Stress: Not on file  ?Social Connections: Not on file  ?Intimate Partner Violence: Not on file  ? ? ?Current Outpatient Medications  ?Medication Instructions  ? cariprazine (VRAYLAR) 1.5 mg, Oral, Daily  ? clonazePAM  (KLONOPIN) 0.5 mg, Oral, 3 times daily PRN  ? cyclobenzaprine (FLEXERIL) 10 MG tablet TAKE 1 TABLET BY MOUTH AT BEDTIME AS NEEDED FOR MUSCLE SPASM  ? ezetimibe (ZETIA) 10 mg, Oral, Daily at bedtime  ? propranolol (INDERAL) 20 mg, Oral, 3 times daily  ? rosuvastatin (CRESTOR) 40 mg, Oral, Daily at bedtime  ? Vilazodone HCl (VIIBRYD) 40 mg, Oral, Daily  ? ? ?   ?Objective:  ? Physical Exam ?BP 124/72 (BP Location: Left Arm, Patient Position: Sitting, Cuff Size: Normal)   Pulse 70   Temp 98.6 ?F (37 ?C) (Oral)   Resp 16   Ht 6\' 2"  (1.88 m)   Wt 225 lb 2 oz (102.1 kg)   SpO2 97%   BMI 28.90 kg/m?  ?General: ?Well developed, NAD, BMI noted ?Neck: No  thyromegaly  ?HEENT:  ?Normocephalic . Face symmetric, atraumatic ?Lungs:  ?CTA B ?Normal respiratory effort, no intercostal retractions, no accessory muscle use. ?Heart: RRR,  no murmur.  ?Abdomen:  ?Not distended, soft, non-tender. No rebound or rigidity.   ?Lower extremities: no pretibial edema bilaterally  ?Skin: Exposed areas without rash. Not pale. Not jaundice ?Neurologic:  ?alert & oriented X3.  ?Speech normal, gait appropriate for age and unassisted ?Strength symmetric and appropriate for age.  ?Psych: ?Cognition and judgment appear intact.  ?  Cooperative with normal attention span and concentration.  ?Behavior appropriate. ?No anxious or depressed appearing. ? ?   ?Assessment   ? ? ASSESSMENT last seen 2015, reestablish 02/18/2017 ?GERD ?Depression, GAD, panic attacks, insomnia (intolerant to Trintellix, Lexapro sertraline caused erectile dysfunction) ?High cholesterol ?CP : w/u 2014, 2015, cath 2017 @ [facility] , 2017 EGD >> was concluded sx were d/t panic  ?CT angio 2015: (-) ?+ FH Sarcoidosis, CAD ? ?PLAN ?Here for CPX ?Depression, GAD, panic attacks, insomnia: ?Since the last visit, he got established with psychiatry, Current medications includes Vraylar, Inderal, Viibryd.  I am still prescribing clonazepam.  Symptoms are not completely well controlled,  encouraged to continue to see psychiatry and advised about the benefits of counseling but he is not willing to try. ?Check UDS. ?Snoring, fatigue, insomnia: ?Based on the symptoms, I requested a Epworth scale, it is 11, + for OSA. ?Refer to neurology, states he won't be able to tolerate a CPAP, we had a long conversation about benefits of the sleep apnea treatment . ?+ FH CAD: Negative cardiac cath 2017, plan is to control CV RF.  Asymptomatic. ?RTC 6 months ? ? ?

## 2021-06-08 NOTE — Patient Instructions (Signed)
We will refer you to neurology for evaluation of his sleep apnea. ? ?GO TO THE LAB : Get the blood work   ? ? ?[place], [place] ?Come back for a checkup in 6 months ?

## 2021-06-09 ENCOUNTER — Encounter: Payer: Self-pay | Admitting: Internal Medicine

## 2021-06-09 LAB — CBC WITH DIFFERENTIAL/PLATELET
Absolute Monocytes: 592 cells/uL (ref 200–950)
Basophils Absolute: 32 cells/uL (ref 0–200)
Basophils Relative: 0.4 %
Eosinophils Absolute: 136 cells/uL (ref 15–500)
Eosinophils Relative: 1.7 %
HCT: 43.7 % (ref 38.5–50.0)
Hemoglobin: 15.4 g/dL (ref 13.2–17.1)
Lymphs Abs: 3104 cells/uL (ref 850–3900)
MCH: 33.8 pg — ABNORMAL HIGH (ref 27.0–33.0)
MCHC: 35.2 g/dL (ref 32.0–36.0)
MCV: 96 fL (ref 80.0–100.0)
MPV: 12 fL (ref 7.5–12.5)
Monocytes Relative: 7.4 %
Neutro Abs: 4136 cells/uL (ref 1500–7800)
Neutrophils Relative %: 51.7 %
Platelets: 233 10*3/uL (ref 140–400)
RBC: 4.55 10*6/uL (ref 4.20–5.80)
RDW: 13 % (ref 11.0–15.0)
Total Lymphocyte: 38.8 %
WBC: 8 10*3/uL (ref 3.8–10.8)

## 2021-06-09 LAB — COMPREHENSIVE METABOLIC PANEL
AG Ratio: 2 (calc) (ref 1.0–2.5)
ALT: 32 U/L (ref 9–46)
AST: 20 U/L (ref 10–40)
Albumin: 4.1 g/dL (ref 3.6–5.1)
Alkaline phosphatase (APISO): 71 U/L (ref 36–130)
BUN: 15 mg/dL (ref 7–25)
CO2: 23 mmol/L (ref 20–32)
Calcium: 9.3 mg/dL (ref 8.6–10.3)
Chloride: 107 mmol/L (ref 98–110)
Creat: 0.97 mg/dL (ref 0.60–1.29)
Globulin: 2.1 g/dL (calc) (ref 1.9–3.7)
Glucose, Bld: 97 mg/dL (ref 65–99)
Potassium: 4.1 mmol/L (ref 3.5–5.3)
Sodium: 142 mmol/L (ref 135–146)
Total Bilirubin: 0.7 mg/dL (ref 0.2–1.2)
Total Protein: 6.2 g/dL (ref 6.1–8.1)

## 2021-06-09 LAB — HEMOGLOBIN A1C
Hgb A1c MFr Bld: 5.6 % of total Hgb (ref <5.7)
Mean Plasma Glucose: 114 mg/dL
eAG (mmol/L): 6.3 mmol/L

## 2021-06-09 NOTE — Assessment & Plan Note (Signed)
-  Tdap 2014 ?-Covid vax: see previous entries  ?-CCS: No FH, previously did not return stool test.  Options discussed again.  Elected I fob ?-Tobacco: 1 pack a day, recommend to reach out when/if ready to quit. ?-Labs:  CMP, CBC, A1c, UDS ?  ?  ?

## 2021-06-09 NOTE — Assessment & Plan Note (Signed)
Here for CPX ?Depression, GAD, panic attacks, insomnia: ?Since the last visit, he got established with psychiatry, Current medications includes Vraylar, Inderal, Viibryd.  I am still prescribing clonazepam.  Symptoms are not completely well controlled, encouraged to continue to see psychiatry and advised about the benefits of counseling but he is not willing to try. ?Check UDS. ?Snoring, fatigue, insomnia: ?Based on the symptoms, I requested a Epworth scale, it is 11, + for OSA. ?Refer to neurology, states he won't be able to tolerate a CPAP, we had a long conversation about benefits of the sleep apnea treatment . ?+ FH CAD: Negative cardiac cath 2017, plan is to control CV RF.  Asymptomatic. ?RTC 6 months ?

## 2021-06-10 LAB — DRUG MONITORING PANEL 375977 , URINE
Alcohol Metabolites: NEGATIVE ng/mL (ref <500)
Alphahydroxyalprazolam: NEGATIVE ng/mL (ref <25)
Alphahydroxymidazolam: NEGATIVE ng/mL (ref <50)
Alphahydroxytriazolam: NEGATIVE ng/mL (ref <50)
Aminoclonazepam: 404 ng/mL — ABNORMAL HIGH (ref <25)
Amphetamines: NEGATIVE ng/mL (ref <500)
Barbiturates: NEGATIVE ng/mL (ref <300)
Benzodiazepines: POSITIVE ng/mL — AB (ref <100)
Cocaine Metabolite: NEGATIVE ng/mL (ref <150)
Desmethyltramadol: NEGATIVE ng/mL (ref <100)
Hydroxyethylflurazepam: NEGATIVE ng/mL (ref <50)
Lorazepam: NEGATIVE ng/mL (ref <50)
Marijuana Metabolite: 522 ng/mL — ABNORMAL HIGH (ref <5)
Marijuana Metabolite: POSITIVE ng/mL — AB (ref <20)
Nordiazepam: NEGATIVE ng/mL (ref <50)
Opiates: NEGATIVE ng/mL (ref <100)
Oxazepam: NEGATIVE ng/mL (ref <50)
Oxycodone: NEGATIVE ng/mL (ref <100)
Temazepam: NEGATIVE ng/mL (ref <50)
Tramadol: NEGATIVE ng/mL (ref <100)

## 2021-06-10 LAB — DM TEMPLATE

## 2021-06-21 ENCOUNTER — Encounter: Payer: Self-pay | Admitting: Behavioral Health

## 2021-06-21 ENCOUNTER — Ambulatory Visit (INDEPENDENT_AMBULATORY_CARE_PROVIDER_SITE_OTHER): Payer: BC Managed Care – PPO | Admitting: Behavioral Health

## 2021-06-21 DIAGNOSIS — F410 Panic disorder [episodic paroxysmal anxiety] without agoraphobia: Secondary | ICD-10-CM | POA: Diagnosis not present

## 2021-06-21 DIAGNOSIS — F331 Major depressive disorder, recurrent, moderate: Secondary | ICD-10-CM

## 2021-06-21 DIAGNOSIS — F411 Generalized anxiety disorder: Secondary | ICD-10-CM | POA: Diagnosis not present

## 2021-06-21 DIAGNOSIS — F329 Major depressive disorder, single episode, unspecified: Secondary | ICD-10-CM

## 2021-06-21 MED ORDER — CARIPRAZINE HCL 3 MG PO CAPS: 3.0000 mg | ORAL_CAPSULE | Freq: Every day | ORAL | 3 refills | Status: DC

## 2021-06-21 MED ORDER — LORAZEPAM 0.5 MG PO TABS: 0.5000 mg | ORAL_TABLET | Freq: Three times a day (TID) | ORAL | 0 refills | Status: DC | PRN

## 2021-06-21 NOTE — Progress Notes (Signed)
[facility]  Patient ID: GALYN KLEMENS,  MRN: [id number]  PCP: Colon Branch, MD  Date of Evaluation: 06/21/2021 Time spent:30 minutes  Chief Complaint:   HISTORY/CURRENT STATUS: HPI  46 year old male presents to this office for follow up and medication management. Says that his anxiety has much improved and is 4/10. He says his depression is still moderate 5/10 and has been feeling much better. No panic attacks since last visit which he is very happy about. Says that he would like to consider a dosage increase of his Vraylar at this time.  Continue to complain of poor energy levels, lack of motivation, and anhedonia but this too has been slowly improving. He is encouraged by the progress.  He is sleeping 7-8 hours every night. He often wakes up in the middle of night to look at clock. He denies mania, no psychosis, no SI/HI.    Past psychiatric medication trials: Lexapro Zoloft Celexa Wellbutrin Trintellix Klonopin  Individual Medical History/ Review of Systems: Changes? :No   Allergies: Patient has no known allergies.  Current Medications:  Current Outpatient Medications:    cariprazine (VRAYLAR) 3 MG capsule, Take 1 capsule (3 mg total) by mouth daily., Disp: 30 capsule, Rfl: 3   LORazepam (ATIVAN) 0.5 MG tablet, Take 1 tablet (0.5 mg total) by mouth every 8 (eight) hours as needed for anxiety., Disp: 90 tablet, Rfl: 0   cariprazine (VRAYLAR) 1.5 MG capsule, Take 1 capsule (1.5 mg total) by mouth daily., Disp: 30 capsule, Rfl: 1   cyclobenzaprine (FLEXERIL) 10 MG tablet, TAKE 1 TABLET BY MOUTH AT BEDTIME AS NEEDED FOR MUSCLE SPASM, Disp: 30 tablet, Rfl: 1   ezetimibe (ZETIA) 10 MG tablet, Take 1 tablet (10 mg total) by mouth at bedtime., Disp: 90 tablet, Rfl: 3   propranolol (INDERAL) 20 MG tablet, Take 1 tablet (20 mg total) by mouth 3 (three) times daily., Disp: 90 tablet, Rfl: 3   rosuvastatin (CRESTOR) 40 MG tablet, Take 1 tablet (40 mg total) by mouth at  bedtime., Disp: 90 tablet, Rfl: 3   Vilazodone HCl (VIIBRYD) 40 MG TABS, Take 1 tablet (40 mg total) by mouth daily., Disp: 30 tablet, Rfl: 3 Medication Side Effects: none  Family Medical/ Social History: Changes? No  MENTAL HEALTH EXAM:  There were no vitals taken for this visit.There is no height or weight on file to calculate BMI.  General Appearance: Casual, Neat, and Well Groomed  Eye Contact:  Good  Speech:  Clear and Coherent  Volume:  Normal  Mood:  Anxious, Depressed, and Dysphoric  Affect:  Appropriate  Thought Process:  Coherent  Orientation:  Full (Time, Place, and Person)  Thought Content: Logical   Suicidal Thoughts:  No  Homicidal Thoughts:  No  Memory:  WNL  Judgement:  Fair  Insight:  Fair  Psychomotor Activity:  Normal  Concentration:  Concentration: Fair  Recall:  [employer] of Knowledge: Fair  Language: Fair  Assets:  Desire for Improvement Resilience Social Support  ADL's:  Intact  Cognition: WNL  Prognosis:  Good    DIAGNOSES:    ICD-10-CM   1. Major depressive disorder, recurrent episode, moderate (HCC)  F33.1 cariprazine (VRAYLAR) 3 MG capsule    2. Generalized anxiety disorder  F41.1 cariprazine (VRAYLAR) 3 MG capsule    LORazepam (ATIVAN) 0.5 MG tablet    3. 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 Panic attacks  F41.0 cariprazine (VRAYLAR)  3 MG capsule      Receiving Psychotherapy: No    RECOMMENDATIONS:   Greater than 50% of 30 min face to face time with patient was spent on counseling and coordination of care.Discussed his moderate improvement with anxiety and depression since last visit. Discussed his past tx, medications, and current medications. Reviewed medication alternatives and possible side effects. I educated pt on proper nutrition and how skipping breakfast and lunch working a physical job was not going to help his energy levels.  We agreed to; Continue  Viibryd to 40 mg  daily Continue   Inderal to 20 mg three times daily for anxiety. If effective  To increase  Vraylar to 3 mg daily Stop Klonopin three times daily prn  Start Ativan 0.5 mg three times daily as needed.  Will report worsening symptoms promptly Provided emergency contact information Discussed potential metabolic side effects associated with atypical antipsychotics, as well as potential risk for movement side effects. Advised pt to contact office if movement side effects occur.   Reviewed PDMP    Elwanda Brooklyn, NP

## 2021-06-25 ENCOUNTER — Encounter: Payer: Self-pay | Admitting: Neurology

## 2021-06-25 ENCOUNTER — Ambulatory Visit: Payer: BC Managed Care – PPO | Admitting: Neurology

## 2021-06-25 VITALS — BP 114/68 | HR 58 | Ht 74.0 in | Wt 225.6 lb

## 2021-06-25 DIAGNOSIS — E663 Overweight: Secondary | ICD-10-CM | POA: Diagnosis not present

## 2021-06-25 DIAGNOSIS — R0683 Snoring: Secondary | ICD-10-CM | POA: Diagnosis not present

## 2021-06-25 DIAGNOSIS — R0681 Apnea, not elsewhere classified: Secondary | ICD-10-CM

## 2021-06-25 DIAGNOSIS — G4719 Other hypersomnia: Secondary | ICD-10-CM | POA: Diagnosis not present

## 2021-06-25 DIAGNOSIS — Z820 Family history of epilepsy and other diseases of the nervous system: Secondary | ICD-10-CM

## 2021-06-25 DIAGNOSIS — F410 Panic disorder [episodic paroxysmal anxiety] without agoraphobia: Secondary | ICD-10-CM

## 2021-06-25 DIAGNOSIS — R002 Palpitations: Secondary | ICD-10-CM

## 2021-06-25 NOTE — Patient Instructions (Signed)

## 2021-06-25 NOTE — Progress Notes (Signed)
Subjective:    Patient ID: Gregory Gilmore is a 46 y.o. male.  HPI    Huston FoleySaima Alexzandrea Normington, MD, PhD [facility] [address], [address] [address] [place], [place] [address]  Dear Dr. Drue NovelPaz,   I saw your patient, Gregory Gilmore, upon your kind request in my sleep clinic today for initial consultation of his sleep disorder, in particular, concern for underlying obstructive sleep apnea.  The patient is unaccompanied today.  As you know, Mr. Gregory Gilmore is a 46 year old right-handed gentleman with an underlying medical history of hyperlipidemia, smoking, anxiety, depression, reflux disease, hyperglycemia and overweight state, who reports snoring and excessive daytime somnolence, as well as witnessed apneas, per wife's report.  I reviewed your office note from 06/08/2021.  His Epworth sleepiness score is 11 out of 24, fatigue severity score is 53 out of 63. He reports a family history of sleep apnea, his uncle has sleep apnea and he has cousins with sleep apnea, all of machines.  He reports a longstanding history of difficulty initiating and maintaining sleep.  He goes to bed around 8 PM and rise time is around 4 AM.  He works 12-hour shifts, from typically 5 AM to 5 PM, Mondays through Fridays and sometimes Saturdays.  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  He lives with his wife and 47 year old son, his 47 year old daughter lives with her mother.  They have 1 dog in the household, he has a TV in the bedroom but does not watch her at night.  He does not drink caffeine daily, rare soda, typically no coffee or tea.  He drinks alcohol occasionally, he smokes 1 pack/day.  His weight has been more or less stable.  He denies night to night nocturia or recurrent morning headaches.  He has woken up at night with a panic attack.  He has had palpitations.  His Past Medical History Is Significant For: Past Medical History:  Diagnosis Date   Anxiety and depression    GERD  (gastroesophageal reflux disease)    Hyperglycemia 07/28/2012   Hyperlipidemia 07/28/2012    His Past Surgical History Is Significant For: Past Surgical History:  Procedure Laterality Date   NO PAST SURGERIES      His Family History Is Significant For: Family History  Problem Relation Age of Onset   Sarcoidosis Mother    CAD Father        cabg 47 y/o   Diabetes Father    Sarcoidosis Maternal Uncle    Sleep apnea Paternal Uncle    Sleep apnea Cousin    Colon cancer Neg Hx    Prostate cancer Neg Hx     His Social History Is Significant For: Social History   Socioeconomic History   Marital status: Married    Spouse name: Not on file   Number of children: 2   Years of education: 12   Highest education level: High school graduate  Occupational History   Occupation: Camera operatoroperation manager    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  Tobacco Use   Smoking status: Every Day    Packs/day: 1.00    Types: Cigarettes   Smokeless tobacco: Never   Tobacco comments:    1 ppd   Vaping Use   Vaping Use: Some days   Substances: Nicotine  Substance and Sexual Activity   Alcohol use: Yes    Comment: OCC   Drug use: No   Sexual activity: Yes    Comment: married  Other Topics Concern   Not on file  Social History Narrative   Original from [place], father was Mykel Sponaugle a pt of mine deceased ~ 03-08-2012   Married, 2 children (one from wife)   finished HS, Airline pilot    Social Determinants of Corporate investment banker Strain: Not on [employer] Insecurity: Not on file  Transportation Needs: Not on file  Physical Activity: Not on file  Stress: Not on file  Social Connections: Not on file    His Allergies Are:  No Known Allergies:   His Current Medications Are:  Outpatient Encounter Medications as of 06/25/2021  Medication Sig   cariprazine (VRAYLAR) 1.5 MG capsule Take 1 capsule (1.5 mg total) by mouth daily.   cariprazine (VRAYLAR) 3 MG capsule Take 1 capsule (3 mg total) by mouth  daily.   cyclobenzaprine (FLEXERIL) 10 MG tablet TAKE 1 TABLET BY MOUTH AT BEDTIME AS NEEDED FOR MUSCLE SPASM   ezetimibe (ZETIA) 10 MG tablet Take 1 tablet (10 mg total) by mouth at bedtime.   LORazepam (ATIVAN) 0.5 MG tablet Take 1 tablet (0.5 mg total) by mouth every 8 (eight) hours as needed for anxiety.   propranolol (INDERAL) 20 MG tablet Take 1 tablet (20 mg total) by mouth 3 (three) times daily.   rosuvastatin (CRESTOR) 40 MG tablet Take 1 tablet (40 mg total) by mouth at bedtime.   Vilazodone HCl (VIIBRYD) 40 MG TABS Take 1 tablet (40 mg total) by mouth daily.   No facility-administered encounter medications on file as of 06/25/2021.  :   Review of Systems:  Out of a complete 14 point review of systems, all are reviewed and negative with the exception of these symptoms as listed below:  Review of Systems  Neurological:        Pt is here for sleep consult   pt states snore,fatigue . Pt denies headaches ,hypertension ,sleep study , CPAP machine    ESS;11 FSS :53   Objective:  Neurological Exam  Physical Exam Physical Examination:   Vitals:   06/25/21 1108  BP: 114/68  Pulse: (!) 58    General Examination: The patient is a very pleasant 46 y.o. male in no acute distress. He appears well-developed and well-nourished and well groomed.   HEENT: Normocephalic, atraumatic, pupils are equal, round and reactive to light, extraocular tracking is good without limitation to gaze excursion or nystagmus noted. Hearing is grossly intact. Face is symmetric with normal facial animation. Speech is clear with no dysarthria noted. There is no hypophonia. There is no lip, neck/head, jaw or voice tremor. Neck is supple with full range of passive and active motion. There are no carotid bruits on auscultation. Oropharynx exam reveals: mild mouth dryness, adequate dental hygiene and mild airway crowding, due to small airway entry, Mallampati class III, smaller tonsils, redundant soft palate and  uvula not fully visualized.  Tongue protrudes centrally and palate elevates symmetrically, neck circumference of 17-5/8 inches.  He has a mild overbite.  Chest: Clear to auscultation without wheezing, rhonchi or crackles noted.  Heart: S1+S2+0, regular and normal without murmurs, rubs or gallops noted.   Abdomen: Soft, non-tender and non-distended.  Extremities: There is no pitting edema in the distal lower extremities bilaterally.   Skin: Warm and dry without trophic changes noted.   Musculoskeletal: exam reveals no obvious joint deformities.   Neurologically:  Mental status: The patient is awake, alert and oriented in all 4 spheres. His immediate and remote memory, attention,  language skills and fund of knowledge are appropriate. There is no evidence of aphasia, agnosia, apraxia or anomia. Speech is clear with normal prosody and enunciation. Thought process is linear. Mood is normal and affect is normal.  Cranial nerves II - XII are as described above under HEENT exam.  Motor exam: Normal bulk, strength and tone is noted. There is no obvious tremor. Fine motor skills and coordination: grossly intact.  Cerebellar testing: No dysmetria or intention tremor. There is no truncal or gait ataxia.  Sensory exam: intact to light touch in the upper and lower extremities.  Gait, station and balance: He stands easily. No veering to one side is noted. No leaning to one side is noted. Posture is age-appropriate and stance is narrow based. Gait shows normal stride length and normal pace. No problems turning are noted.   Assessment and Plan:  In summary, QUINTIN HJORT is a very pleasant 46 y.o.-year old male with an underlying medical history of hyperlipidemia, smoking, anxiety, depression, reflux disease, hyperglycemia and overweight state, whose history and physical exam are concerning for obstructive sleep apnea (OSA). I had a long chat with the patient about my findings and the diagnosis of OSA, its  prognosis and treatment options. We talked about medical treatments, surgical interventions and non-pharmacological approaches. I explained in particular the risks and ramifications of untreated moderate to severe OSA, especially with respect to developing cardiovascular disease down the Road, including congestive heart failure, difficult to treat hypertension, cardiac arrhythmias, or stroke. Even type 2 diabetes has, in part, been linked to untreated OSA. Symptoms of untreated OSA include daytime sleepiness, memory problems, mood irritability and mood disorder such as depression and anxiety, lack of energy, as well as recurrent headaches, especially morning headaches. We talked about smoking cessation and trying to maintain a healthy lifestyle in general, as well as the importance of weight control. We also talked about the importance of good sleep hygiene. I recommended the following at this time: sleep study.  I outlined the differences between a laboratory attended sleep study versus home sleep testing. I explained the sleep test procedure to the patient and also outlined possible surgical and non-surgical treatment options of OSA, including the use of a custom-made dental device (which would require a referral to a specialist dentist or oral surgeon), upper airway surgical options, such as traditional UPPP or a novel less invasive surgical option in the form of Inspire hypoglossal nerve stimulation (which would involve a referral to an ENT surgeon). I also explained the CPAP treatment option to the patient, who indicated that he would be willing to try PAP therapy, if the need arises. I explained the importance of being compliant with PAP treatment, not only for insurance purposes but primarily to improve His symptoms, and for the patient's long term health benefit, including to reduce His cardiovascular risks. I answered all his questions today and the patient was in agreement. I plan to see him back after  the sleep study is completed and encouraged him to call with any interim questions, concerns, problems or updates.   Thank you very much for allowing me to participate in the care of this nice patient. If I can be of any further assistance to you please do not hesitate to call me at [phone].  Sincerely,   Huston Foley, MD, PhD

## 2021-07-09 ENCOUNTER — Encounter: Payer: Self-pay | Admitting: Internal Medicine

## 2021-07-11 ENCOUNTER — Telehealth: Payer: Self-pay | Admitting: Neurology

## 2021-07-11 NOTE — Telephone Encounter (Signed)
[employer] Berkley Harvey: [id number] (exp. 07/04/21 to 09/01/21) patient is scheduled at [facility] for 07/24/21 at 9:00 AM.

## 2021-07-24 ENCOUNTER — Ambulatory Visit: Payer: BC Managed Care – PPO | Admitting: Neurology

## 2021-07-24 DIAGNOSIS — G4710 Hypersomnia, unspecified: Secondary | ICD-10-CM | POA: Diagnosis not present

## 2021-07-24 DIAGNOSIS — F410 Panic disorder [episodic paroxysmal anxiety] without agoraphobia: Secondary | ICD-10-CM

## 2021-07-24 DIAGNOSIS — G4719 Other hypersomnia: Secondary | ICD-10-CM

## 2021-07-24 DIAGNOSIS — E663 Overweight: Secondary | ICD-10-CM

## 2021-07-24 DIAGNOSIS — R0683 Snoring: Secondary | ICD-10-CM

## 2021-07-24 DIAGNOSIS — R0681 Apnea, not elsewhere classified: Secondary | ICD-10-CM

## 2021-07-24 DIAGNOSIS — Z820 Family history of epilepsy and other diseases of the nervous system: Secondary | ICD-10-CM

## 2021-07-24 DIAGNOSIS — R002 Palpitations: Secondary | ICD-10-CM

## 2021-07-26 NOTE — Procedures (Signed)
   [facility]  HOME SLEEP TEST (Watch PAT) REPORT  STUDY DATE: 07/24/2021  DOB: [birth year]  MRN: [id number]  ORDERING CLINICIAN: Huston Foley, MD, PhD   REFERRING CLINICIAN: Wanda Plump, MD   CLINICAL INFORMATION/HISTORY: 46 year old right-handed gentleman with an underlying medical history of hyperlipidemia, smoking, anxiety, depression, reflux disease, hyperglycemia and overweight state, who reports snoring and excessive daytime somnolence, as well as witnessed apneas.  Epworth sleepiness score: 11/24.  BMI: 28.9 kg/m  FINDINGS:   Sleep Summary:   Total Recording Time (hours, min): 7 hours, 24 minutes  Total Sleep Time (hours, min):  6 hours, 11 minutes   Percent REM (%):    24%   Respiratory Indices:   Calculated pAHI (per hour):  3.7/hour         REM pAHI:    4.9/hour       NREM pAHI: 3.4/hour  Oxygen Saturation Statistics:    Oxygen Saturation (%) Mean: 94%   Minimum oxygen saturation (%):                 91%   O2 Saturation Range (%): 91-98%    O2 Saturation (minutes) <=88%: 0 min  Pulse Rate Statistics:   Pulse Mean (bpm):    54/min    Pulse Range (43-92/min)   IMPRESSION: Primary snoring   RECOMMENDATION:  This home sleep test does not demonstrate any significant obstructive or central sleep disordered breathing with a total pAHI of 3.7/h, O2 nadir 91%.  Intermittent mild to moderate snoring was detected.  At times, snoring was in the louder range.  Positive airway pressure treatment with a CPAP or AutoPap machine is not indicated.  Weight loss and avoiding the supine sleep position may alleviate his snoring; for disturbing snoring, a dental device through dentistry or orthodontics can be considered.  Other causes of the patient's symptoms, including circadian rhythm disturbances, an underlying mood disorder, medication effect and/or an underlying medical problem cannot be ruled out based on this test. Clinical correlation is  recommended. The patient should be cautioned not to drive, work at heights, or operate dangerous or heavy equipment when tired or sleepy. Review and reiteration of good sleep hygiene measures should be pursued with any patient. The patient can follow up with his referring provider, who will be notified of the test results. An appointment in sleep clinic can be made as necessary.   I certify that I have reviewed the raw data recording prior to the issuance of this report in accordance with the standards of the American Academy of Sleep Medicine (AASM).  INTERPRETING PHYSICIAN:   Huston Foley, MD, PhD  Board Certified in Neurology and Sleep Medicine  [facility] [address] [place], [place] [address] [phone]

## 2021-08-02 ENCOUNTER — Telehealth: Payer: Self-pay | Admitting: *Deleted

## 2021-08-02 ENCOUNTER — Other Ambulatory Visit: Payer: Self-pay | Admitting: Behavioral Health

## 2021-08-02 DIAGNOSIS — F411 Generalized anxiety disorder: Secondary | ICD-10-CM

## 2021-08-02 NOTE — Telephone Encounter (Signed)
-----   Message from Huston Foley, MD sent at 07/26/2021  5:18 PM EDT ----- Patient referred by Dr. Drue Novel, seen by me on 06/25/2021, HST on 07/24/2021.   Please call and notify the patient that the recent home sleep test did not show any significant obstructive sleep apnea, with a total AHI of 3.7/h, O2 nadir 91%.  Intermittent mild to moderate snoring was detected.  At times, snoring was in the louder range.  Positive airway pressure treatment with a CPAP or AutoPap machine is not indicated.  Weight loss and avoiding the supine sleep position may alleviate his snoring; for disturbing snoring, a dental device through dentistry or orthodontics can be considered.  However, a dental device may or may not be covered by his insurance for primary snoring.  If he would like to get a referral to dentistry, we can certainly facilitate. Patient can follow up with the referring provider.  Thanks,  Huston Foley, MD, PhD [facility])

## 2021-08-02 NOTE — Telephone Encounter (Signed)
Spoke with patient and discussed sleep study results. Pt aware no significant OSA was noted. Pt states he doesn't sleep on his back. He declined referral to dentistry. He verbalized appreciation for the call and was advised to f/u with Dr Drue Novel. Results forwarded to Dr Drue Novel.

## 2021-08-31 ENCOUNTER — Encounter: Payer: Self-pay | Admitting: Behavioral Health

## 2021-08-31 ENCOUNTER — Telehealth: Payer: BC Managed Care – PPO | Admitting: Behavioral Health

## 2021-08-31 DIAGNOSIS — F411 Generalized anxiety disorder: Secondary | ICD-10-CM | POA: Diagnosis not present

## 2021-08-31 DIAGNOSIS — F331 Major depressive disorder, recurrent, moderate: Secondary | ICD-10-CM | POA: Diagnosis not present

## 2021-08-31 DIAGNOSIS — F5105 Insomnia due to other mental disorder: Secondary | ICD-10-CM | POA: Diagnosis not present

## 2021-08-31 DIAGNOSIS — F410 Panic disorder [episodic paroxysmal anxiety] without agoraphobia: Secondary | ICD-10-CM

## 2021-08-31 DIAGNOSIS — F329 Major depressive disorder, single episode, unspecified: Secondary | ICD-10-CM | POA: Diagnosis not present

## 2021-08-31 DIAGNOSIS — F99 Mental disorder, not otherwise specified: Secondary | ICD-10-CM

## 2021-08-31 MED ORDER — PROPRANOLOL HCL 20 MG PO TABS: 20.0000 mg | ORAL_TABLET | Freq: Three times a day (TID) | ORAL | 3 refills | Status: DC

## 2021-08-31 MED ORDER — TRAZODONE HCL 50 MG PO TABS: 50.0000 mg | ORAL_TABLET | Freq: Every day | ORAL | 1 refills | Status: DC

## 2021-08-31 MED ORDER — CARIPRAZINE HCL 3 MG PO CAPS: 3.0000 mg | ORAL_CAPSULE | Freq: Every day | ORAL | 3 refills | Status: DC

## 2021-08-31 MED ORDER — VILAZODONE HCL 40 MG PO TABS: 40.0000 mg | ORAL_TABLET | Freq: Every day | ORAL | 3 refills | Status: DC

## 2021-08-31 MED ORDER — LORAZEPAM 0.5 MG PO TABS: ORAL_TABLET | ORAL | 0 refills | Status: DC

## 2021-08-31 NOTE — Progress Notes (Signed)
Gregory Gilmore [id number] 1975-03-27 46 y.o.  Virtual Visit via Video Note  I connected with pt @ on 08/31/21 at  3:30 PM EDT by a video enabled telemedicine application and verified that I am speaking with the correct person using two identifiers.   I discussed the limitations of evaluation and management by telemedicine and the availability of in person appointments. The patient expressed understanding and agreed to proceed.  I discussed the assessment and treatment plan with the patient. The patient was provided an opportunity to ask questions and all were answered. The patient agreed with the plan and demonstrated an understanding of the instructions.   The patient was advised to call back or seek an in-person evaluation if the symptoms worsen or if the condition fails to improve as anticipated.  I provided 20 minutes of non-face-to-face time during this encounter.  The patient was located at home.  The provider was located at [facility].   Joan Flores, NP   Subjective:   Patient ID:  Gregory Gilmore is a 46 y.o. (DOB 1975/09/01) male.  Chief Complaint:  Chief Complaint  Patient presents with   Depression   Anxiety   Panic Attack   Follow-up   Medication Refill   Insomnia    HPI Gregory Gilmore, 46 year old male presents to this office via video visit for follow up and medication management. Says that his anxiety has much improved and is 2/10. He says his depression is  now 2/10 and has been feeling much better. No panic attacks since last visit which he is very happy about. He would like to consider something besides Ambien for sleep that previous MD wrote. Still having trouble staying asleep.  Continue to complain of poor energy levels, lack of motivation, and anhedonia but this too has been slowly improving. He is encouraged by the progress.  He is sleeping 7-8 hours every night. He denies mania, no psychosis, no SI/HI.   Past psychiatric medication  trials: Lexapro Zoloft Celexa Wellbutrin Trintellix Klonopin   Review of Systems:  Review of Systems  Medications: I have reviewed the patient's current medications.  Current Outpatient Medications  Medication Sig Dispense Refill   traZODone (DESYREL) 50 MG tablet Take 1 tablet (50 mg total) by mouth at bedtime. 30 tablet 1   cariprazine (VRAYLAR) 1.5 MG capsule Take 1 capsule (1.5 mg total) by mouth daily. 30 capsule 1   cariprazine (VRAYLAR) 3 MG capsule Take 1 capsule (3 mg total) by mouth daily. 30 capsule 3   cyclobenzaprine (FLEXERIL) 10 MG tablet TAKE 1 TABLET BY MOUTH AT BEDTIME AS NEEDED FOR MUSCLE SPASM 30 tablet 1   ezetimibe (ZETIA) 10 MG tablet Take 1 tablet (10 mg total) by mouth at bedtime. 90 tablet 3   LORazepam (ATIVAN) 0.5 MG tablet TAKE 1 TABLET(0.5 MG) BY MOUTH EVERY 8 HOURS AS NEEDED FOR ANXIETY 90 tablet 0   propranolol (INDERAL) 20 MG tablet Take 1 tablet (20 mg total) by mouth 3 (three) times daily. 90 tablet 3   rosuvastatin (CRESTOR) 40 MG tablet Take 1 tablet (40 mg total) by mouth at bedtime. 90 tablet 3   Vilazodone HCl (VIIBRYD) 40 MG TABS Take 1 tablet (40 mg total) by mouth daily. 30 tablet 3   No current facility-administered medications for this visit.    Medication Side Effects: None  Allergies: No Known Allergies  Past Medical History:  Diagnosis Date   Anxiety and depression    GERD (gastroesophageal reflux  disease)    Hyperglycemia 07/28/2012   Hyperlipidemia 07/28/2012    Family History  Problem Relation Age of Onset   Sarcoidosis Mother    CAD Father        cabg 16 y/o   Diabetes Father    Sarcoidosis Maternal Uncle    Sleep apnea Paternal Uncle    Sleep apnea Cousin    Colon cancer Neg Hx    Prostate cancer Neg Hx     Social History   Socioeconomic History   Marital status: Married    Spouse name: Not on file   Number of children: 2   Years of education: 12   Highest education level: High school graduate   Occupational History   Occupation: Camera operator    Occupation: Corporate treasurer company  Tobacco Use   Smoking status: Every Day    Packs/day: 1.00    Types: Cigarettes   Smokeless tobacco: Never   Tobacco comments:    1 ppd   Vaping Use   Vaping Use: Some days   Substances: Nicotine  Substance and Sexual Activity   Alcohol use: Yes    Comment: OCC   Drug use: No   Sexual activity: Yes    Comment: married  Other Topics Concern   Not on file  Social History Narrative   Original from [place], father was Kamori Barbier a pt of mine deceased ~ 02/29/2012   Married, 2 children (one from wife)   finished HS, Airline pilot    Social Determinants of Corporate investment banker Strain: Not on file  Food Insecurity: Not on file  Transportation Needs: Not on file  Physical Activity: Not on file  Stress: Not on file  Social Connections: Not on file  Intimate Partner Violence: Not on file    Past Medical History, Surgical history, Social history, and Family history were reviewed and updated as appropriate.   Please see review of systems for further details on the patient's review from today.   Objective:   Physical Exam:  There were no vitals taken for this visit.  Physical Exam  Lab Review:     Component Value Date/Time   NA 142 06/08/2021 1513   K 4.1 06/08/2021 1513   CL 107 06/08/2021 1513   CO2 23 06/08/2021 1513   GLUCOSE 97 06/08/2021 1513   BUN 15 06/08/2021 1513   CREATININE 0.97 06/08/2021 1513   CALCIUM 9.3 06/08/2021 1513   PROT 6.2 06/08/2021 1513   ALBUMIN 4.4 05/31/2019 1519   AST 20 06/08/2021 1513   ALT 32 06/08/2021 1513   ALKPHOS 75 05/31/2019 1519   BILITOT 0.7 06/08/2021 1513   GFRNONAA >90 04/23/2013 1030   GFRAA >90 04/23/2013 1030       Component Value Date/Time   WBC 8.0 06/08/2021 1513   RBC 4.55 06/08/2021 1513   HGB 15.4 06/08/2021 1513   HCT 43.7 06/08/2021 1513   PLT 233 06/08/2021 1513   MCV 96.0 06/08/2021 1513   MCH 33.8 (H)  06/08/2021 1513   MCHC 35.2 06/08/2021 1513   RDW 13.0 06/08/2021 1513   LYMPHSABS 3,104 06/08/2021 1513   MONOABS 0.6 05/18/2020 1115   EOSABS 136 06/08/2021 1513   BASOSABS 32 06/08/2021 1513    No results found for: "POCLITH", "LITHIUM"   No results found for: "PHENYTOIN", "PHENOBARB", "VALPROATE", "CBMZ"   .res Assessment: Plan:     RECOMMENDATIONS:    Greater than 50% of 20 min video visit  with patient was  spent on counseling and coordination of care.Discussed his significant  improvement with anxiety and depression since last visit. He reports no panic attacks since  changing his medication regimen.  We agreed to; Continue  Viibryd to 40 mg daily Continue   Inderal to 20 mg three times daily for anxiety. If effective  Continue Vraylar to 3 mg daily To start Trazodone 50 mg daily at bedtime Start Ativan 0.5 mg three times daily as needed.  Will report worsening symptoms promptly Provided emergency contact information Discussed potential metabolic side effects associated with atypical antipsychotics, as well as potential risk for movement side effects. Advised pt to contact office if movement side effects occur.   Reviewed PDMP     Joan Flores, NP         Wayland was seen today for depression, anxiety, panic attack, follow-up, medication refill and insomnia.  Diagnoses and all orders for this visit:  Insomnia due to other mental disorder -     traZODone (DESYREL) 50 MG tablet; Take 1 tablet (50 mg total) by mouth at bedtime.  Generalized anxiety disorder -     cariprazine (VRAYLAR) 3 MG capsule; Take 1 capsule (3 mg total) by mouth daily. -     LORazepam (ATIVAN) 0.5 MG tablet; TAKE 1 TABLET(0.5 MG) BY MOUTH EVERY 8 HOURS AS NEEDED FOR ANXIETY -     propranolol (INDERAL) 20 MG tablet; Take 1 tablet (20 mg total) by mouth 3 (three) times daily. -     Vilazodone HCl (VIIBRYD) 40 MG TABS; Take 1 tablet (40 mg total) by mouth daily.  Depression resistant to  treatment -     cariprazine (VRAYLAR) 3 MG capsule; Take 1 capsule (3 mg total) by mouth daily.  Major depressive disorder, recurrent episode, moderate (HCC) -     cariprazine (VRAYLAR) 3 MG capsule; Take 1 capsule (3 mg total) by mouth daily. -     Vilazodone HCl (VIIBRYD) 40 MG TABS; Take 1 tablet (40 mg total) by mouth daily.  Panic attacks -     cariprazine (VRAYLAR) 3 MG capsule; Take 1 capsule (3 mg total) by mouth daily.     Please see After Visit Summary for patient specific instructions.  Future Appointments  Date Time Provider Department Center  11/30/2021  3:00 PM Avelina Laine A, NP [facility] None    No orders of the defined types were placed in this encounter.     -------------------------------

## 2021-09-10 ENCOUNTER — Telehealth: Payer: Self-pay | Admitting: Internal Medicine

## 2021-09-10 MED ORDER — CYCLOBENZAPRINE HCL 10 MG PO TABS: ORAL_TABLET | ORAL | 1 refills | Status: DC

## 2021-09-10 MED ORDER — ROSUVASTATIN CALCIUM 40 MG PO TABS: 40.0000 mg | ORAL_TABLET | Freq: Every day | ORAL | 1 refills | Status: DC

## 2021-09-10 NOTE — Telephone Encounter (Signed)
Rx's sent. Pt due for visit in 12/2021- will you call him to schedule please?

## 2021-09-10 NOTE — Telephone Encounter (Signed)
Medication: cyclobenzaprine (FLEXERIL) 10 MG tablet   rosuvastatin (CRESTOR) 40 MG tablet   Has the patient contacted their pharmacy? No.   Preferred Pharmacy: [facility] [phone] - [place], [place] - [phone] AT [facility] [address] & [address]   [address] [place] [address]  Phone:  [phone]  Fax:  [phone]

## 2021-09-12 NOTE — Telephone Encounter (Signed)
Vm left to schedule.  

## 2021-10-01 ENCOUNTER — Encounter: Payer: Self-pay | Admitting: Internal Medicine

## 2021-10-10 ENCOUNTER — Other Ambulatory Visit: Payer: Self-pay

## 2021-10-10 ENCOUNTER — Telehealth: Payer: Self-pay | Admitting: Internal Medicine

## 2021-10-10 ENCOUNTER — Telehealth: Payer: Self-pay | Admitting: Behavioral Health

## 2021-10-10 MED ORDER — CLONAZEPAM 0.5 MG PO TABS: 0.5000 mg | ORAL_TABLET | Freq: Three times a day (TID) | ORAL | 0 refills | Status: DC | PRN

## 2021-10-10 MED ORDER — CYCLOBENZAPRINE HCL 10 MG PO TABS: ORAL_TABLET | ORAL | 1 refills | Status: DC

## 2021-10-10 NOTE — Telephone Encounter (Signed)
Pt LVM @ 9:12a.  He wants to know if he can switch back to Clonazepam instead of Ativan.  Next appt 10/27

## 2021-10-10 NOTE — Telephone Encounter (Addendum)
Patient has been on clonazepam for several years, last dose 0.5 mg TID prn. He felt like he had built up a tolerance.  He has been on lorazepam a couple of months and he said he feels off and is having more panic attacks than he did on clonazepam. Brian's note said no PA with change to clonazepam. He is asking to go back to clonazepam, does not have any left.   I have pended 1 RF of clonazepam.     From July note:  Continue  Viibryd to 40 mg daily Continue   Inderal to 20 mg three times daily for anxiety. If effective  Continue Vraylar to 3 mg daily To start Trazodone 50 mg daily at bedtime Start Ativan 0.5 mg three times daily as needed.

## 2021-10-10 NOTE — Telephone Encounter (Signed)
Rx sent 

## 2021-10-10 NOTE — Telephone Encounter (Signed)
Pt called stating the Ativan is not working as well as Clonazepam. Having more attacks with Ativan than Clonazepam. [facility] [address] PT 10/27

## 2021-10-10 NOTE — Telephone Encounter (Signed)
sent 

## 2021-10-10 NOTE — Telephone Encounter (Signed)
Medication: cyclobenzaprine (FLEXERIL) 10 MG tablet   Has the patient contacted their pharmacy? No. No refills on bottle  Preferred Pharmacy (with phone number or street name): [facility] [phone] [place], [place] [phone] AT [facility] [address]  [address] [place] [address]  Phone:  [phone]  Fax:  [phone]   Agent: Please be advised that RX refills may take up to 3 business days. We ask that you follow-up with your pharmacy.

## 2021-11-12 ENCOUNTER — Other Ambulatory Visit: Payer: Self-pay

## 2021-11-12 ENCOUNTER — Telehealth: Payer: Self-pay | Admitting: Behavioral Health

## 2021-11-12 MED ORDER — CLONAZEPAM 0.5 MG PO TABS
0.5000 mg | ORAL_TABLET | Freq: Three times a day (TID) | ORAL | 0 refills | Status: DC | PRN
Start: 2021-11-12 — End: 2021-12-13

## 2021-11-12 MED ORDER — CLONAZEPAM 0.5 MG PO TABS
0.5000 mg | ORAL_TABLET | Freq: Three times a day (TID) | ORAL | 0 refills | Status: DC | PRN
Start: 2021-11-12 — End: 2021-11-12

## 2021-11-12 NOTE — Telephone Encounter (Signed)
Pt called requesting RF Clonazepam 0.5 mg 3/d #90  [facility] [address]

## 2021-11-12 NOTE — Telephone Encounter (Signed)
Pended.

## 2021-11-16 ENCOUNTER — Other Ambulatory Visit: Payer: Self-pay | Admitting: Behavioral Health

## 2021-11-16 DIAGNOSIS — F5105 Insomnia due to other mental disorder: Secondary | ICD-10-CM

## 2021-11-30 ENCOUNTER — Telehealth: Payer: BC Managed Care – PPO | Admitting: Behavioral Health

## 2021-12-13 ENCOUNTER — Ambulatory Visit (INDEPENDENT_AMBULATORY_CARE_PROVIDER_SITE_OTHER): Payer: BC Managed Care – PPO | Admitting: Behavioral Health

## 2021-12-13 ENCOUNTER — Telehealth: Payer: Self-pay | Admitting: Internal Medicine

## 2021-12-13 ENCOUNTER — Encounter: Payer: Self-pay | Admitting: Behavioral Health

## 2021-12-13 DIAGNOSIS — F411 Generalized anxiety disorder: Secondary | ICD-10-CM

## 2021-12-13 MED ORDER — CYCLOBENZAPRINE HCL 10 MG PO TABS: ORAL_TABLET | ORAL | 1 refills | Status: DC

## 2021-12-13 MED ORDER — CLONAZEPAM 0.5 MG PO TABS: 0.5000 mg | ORAL_TABLET | Freq: Three times a day (TID) | ORAL | 1 refills | Status: DC | PRN

## 2021-12-13 NOTE — Progress Notes (Signed)
[facility]  Patient ID: Gregory Gilmore,  MRN: [id number]  PCP: Wanda Plump, MD  Date of Evaluation: 12/13/2021 Time spent:30 minutes  Chief Complaint:  Chief Complaint   Anxiety; Depression; Follow-up; Medication Refill; Patient Education; Medication Problem     HISTORY/CURRENT STATUS: HPI  Gregory Gilmore, 46 year old male presents to this office for follow up and medication management. Says that his anxiety has much improved and is 1/10. He says his depression is  now 1/10 and has been feeling much better. No panic attacks since last visit which he is very happy about. Says he stopped all his meds but Klonopin cold [place] and is doing fine. Does not want to take additional meds.  He is sleeping 7-8 hours every night. He denies mania, no psychosis, no SI/HI.    Past psychiatric medication trials: Lexapro Zoloft Celexa Wellbutrin Trintellix Klonopin Vraylar Trazodone Ativan Viibryd     Individual Medical History/ Review of Systems: Changes? :No   Allergies: Patient has no known allergies.  Current Medications:  Current Outpatient Medications:    clonazePAM (KLONOPIN) 0.5 MG tablet, Take 1 tablet (0.5 mg total) by mouth 3 (three) times daily as needed for anxiety., Disp: 90 tablet, Rfl: 1   cyclobenzaprine (FLEXERIL) 10 MG tablet, TAKE 1 TABLET BY MOUTH AT BEDTIME AS NEEDED FOR MUSCLE SPASM, Disp: 30 tablet, Rfl: 1   ezetimibe (ZETIA) 10 MG tablet, Take 1 tablet (10 mg total) by mouth at bedtime., Disp: 90 tablet, Rfl: 3   rosuvastatin (CRESTOR) 40 MG tablet, Take 1 tablet (40 mg total) by mouth at bedtime., Disp: 90 tablet, Rfl: 1 Medication Side Effects: none  Family Medical/ Social History: Changes? No  MENTAL HEALTH EXAM:  There were no vitals taken for this visit.There is no height or weight on file to calculate BMI.  General Appearance: Casual, Neat, and Well Groomed  Eye Contact:  Good  Speech:  Clear and Coherent  Volume:  Normal  Mood:   Anxious  Affect:  Appropriate  Thought Process:  Coherent  Orientation:  Full (Time, Place, and Person)  Thought Content: Logical   Suicidal Thoughts:  No  Homicidal Thoughts:  No  Memory:  WNL  Judgement:  Good  Insight:  Good  Psychomotor Activity:  Normal  Concentration:  Concentration: Good  Recall:  Good  Fund of Knowledge: Good  Language: Good  Assets:  Desire for Improvement  ADL's:  Intact  Cognition: WNL  Prognosis:  Good    DIAGNOSES:    ICD-10-CM   1. Generalized anxiety disorder  F41.1 clonazePAM (KLONOPIN) 0.5 MG tablet      Receiving Psychotherapy: No    RECOMMENDATIONS:  Greater than 50% of 30 min face to face visit with patient was spent on counseling and coordination of care.Discussed his significant  improvement with anxiety and depression since last visit. Patient stopped all his medication except Klonopin. Stated he did think he needed them anymore. Says he is usually only taking two 0.5 tablets of Klonopin per day and he does not want to take more medication. I educated him of risk of stopping meds cold [place].  We agreed to; Stopped Viibryd to 40 mg daily Stopped  Inderal to 20 mg three times daily for anxiety. If effective  Stopped Vraylar to 3 mg daily Stopped Trazodone 50 mg daily at bedtime Stopped Ativan 0.5 mg three times daily as needed.  Continue Klonopin 0.5 mg three times daily as needed Will report worsening symptoms promptly Provided  emergency contact information Discussed potential metabolic side effects associated with atypical antipsychotics, as well as potential risk for movement side effects. Advised pt to contact office if movement side effects occur.   Discussed potential benefits, risk, and side effects of benzodiazepines to include potential risk of tolerance and dependence, as well as possible drowsiness.  Advised patient not to drive if experiencing drowsiness and to take lowest possible effective dose to minimize risk of  dependence and tolerance.  Reviewed PDMP      Joan Flores, NP

## 2021-12-13 NOTE — Telephone Encounter (Signed)
Pt scheduled to see pcp in December but is hoping to get a refill to last until then. Please advise.   cyclobenzaprine (FLEXERIL) 10 MG tablet   [facility] [phone] - [place], [place] - [address] AT [facility] [address] & [address] [place] [address] Phone: [phone]  Fax: [phone]

## 2021-12-13 NOTE — Telephone Encounter (Signed)
Rx sent 

## 2022-01-14 ENCOUNTER — Ambulatory Visit: Payer: BC Managed Care – PPO | Admitting: Internal Medicine

## 2022-01-14 ENCOUNTER — Encounter: Payer: Self-pay | Admitting: Internal Medicine

## 2022-01-14 VITALS — BP 132/84 | HR 103 | Temp 98.1°F | Resp 18 | Ht 74.0 in | Wt 226.0 lb

## 2022-01-14 DIAGNOSIS — F411 Generalized anxiety disorder: Secondary | ICD-10-CM | POA: Diagnosis not present

## 2022-01-14 DIAGNOSIS — R0683 Snoring: Secondary | ICD-10-CM | POA: Diagnosis not present

## 2022-01-14 MED ORDER — CYCLOBENZAPRINE HCL 10 MG PO TABS
ORAL_TABLET | ORAL | 5 refills | Status: DC
Start: 2022-01-14 — End: 2022-08-07

## 2022-01-14 MED ORDER — CLONAZEPAM 0.5 MG PO TABS: 0.5000 mg | ORAL_TABLET | Freq: Three times a day (TID) | ORAL | 4 refills | Status: DC | PRN

## 2022-01-14 NOTE — Assessment & Plan Note (Signed)
Snoring, fatigue, insomnia: Sleep study negative, was recommended a dental appliance to decrease his snoring.  Overall feels much better Depression, GAD, panic attacks, insomnia: Since the last visit he continue working with psychiatry, Vraylar-Inderal-Viibryd-discontinue. Still doing psychotherapy. Temporarily tried lorazepam but  clonazepam works better for him. PDMP reviewed, prescription for clonazepam sent. Thoracic back pain: RF Flexeril RTC 5 months CPX

## 2022-01-14 NOTE — Progress Notes (Signed)
   Subjective:    Patient ID: Gregory Gilmore, male    DOB: 1975-04-19, 46 y.o.   MRN: [id number]  DOS:  01/14/2022 Type of visit - description: f/u  Since the last office visit is feeling well. Flexeril continue to help with MSK pains. Clonazepam works for him, other medications have been discontinued by psychiatry. "I feel better than in long time"  Review of Systems See above   Past Medical History:  Diagnosis Date   Anxiety and depression    GERD (gastroesophageal reflux disease)    Hyperglycemia 07/28/2012   Hyperlipidemia 07/28/2012    Past Surgical History:  Procedure Laterality Date   NO PAST SURGERIES      Current Outpatient Medications  Medication Instructions   clonazePAM (KLONOPIN) 0.5 mg, Oral, 3 times daily PRN   cyclobenzaprine (FLEXERIL) 10 MG tablet TAKE 1 TABLET BY MOUTH AT BEDTIME AS NEEDED FOR MUSCLE SPASM   ezetimibe (ZETIA) 10 mg, Oral, Daily at bedtime   rosuvastatin (CRESTOR) 40 mg, Oral, Daily at bedtime       Objective:   Physical Exam BP 132/84   Pulse (!) 103   Temp 98.1 F (36.7 C) (Oral)   Resp 18   Ht 6\' 2"  (1.88 m)   Wt 226 lb (102.5 kg)   SpO2 97%   BMI 29.02 kg/m  General:   Well developed, NAD, BMI noted. HEENT:  Normocephalic . Face symmetric, atraumatic Lungs:  CTA B Normal respiratory effort, no intercostal retractions, no accessory muscle use. Heart: RRR,  no murmur.  Lower extremities: no pretibial edema bilaterally  Skin: Not pale. Not jaundice Neurologic:  alert & oriented X3.  Speech normal, gait appropriate for age and unassisted Psych--  Cognition and judgment appear intact.  Cooperative with normal attention span and concentration.  Behavior appropriate. No anxious or depressed appearing.      Assessment    ASSESSMENT last seen 2015, reestablish 02/18/2017 GERD Depression, GAD, panic attacks, insomnia (intolerant to Trintellix, Lexapro sertraline caused erectile dysfunction) High cholesterol CP : w/u  2014, 2015, cath 2017 @ [facility] , 2017 EGD >> was concluded sx were d/t panic  CT angio 2015: (-) Sleep study 07-2021: No significant OSA,  Rx dental appliance to decrease his snoring Thoracic back pain: On chronic Flexeril + FH Sarcoidosis, CAD  PLAN Snoring, fatigue, insomnia: Sleep study negative, was recommended a dental appliance to decrease his snoring.  Overall feels much better Depression, GAD, panic attacks, insomnia: Since the last visit he continue working with psychiatry, Vraylar-Inderal-Viibryd-discontinue. Still doing psychotherapy. Temporarily tried lorazepam but  clonazepam works better for him. PDMP reviewed, prescription for clonazepam sent. Thoracic back pain: RF Flexeril RTC 5 months CPX

## 2022-01-14 NOTE — Patient Instructions (Signed)
I sent your refills  Take care  Enjoy your Christmas!     GO TO THE FRONT DESK, PLEASE SCHEDULE YOUR APPOINTMENTS Come back for   physical exam by May 2024

## 2022-01-29 ENCOUNTER — Other Ambulatory Visit: Payer: Self-pay | Admitting: Behavioral Health

## 2022-01-29 DIAGNOSIS — F410 Panic disorder [episodic paroxysmal anxiety] without agoraphobia: Secondary | ICD-10-CM

## 2022-01-29 DIAGNOSIS — F411 Generalized anxiety disorder: Secondary | ICD-10-CM

## 2022-01-29 DIAGNOSIS — F331 Major depressive disorder, recurrent, moderate: Secondary | ICD-10-CM

## 2022-01-29 DIAGNOSIS — F329 Major depressive disorder, single episode, unspecified: Secondary | ICD-10-CM

## 2022-03-15 ENCOUNTER — Ambulatory Visit (INDEPENDENT_AMBULATORY_CARE_PROVIDER_SITE_OTHER): Payer: BC Managed Care – PPO | Admitting: Behavioral Health

## 2022-03-15 ENCOUNTER — Encounter: Payer: Self-pay | Admitting: Behavioral Health

## 2022-03-15 DIAGNOSIS — F410 Panic disorder [episodic paroxysmal anxiety] without agoraphobia: Secondary | ICD-10-CM | POA: Diagnosis not present

## 2022-03-15 DIAGNOSIS — F411 Generalized anxiety disorder: Secondary | ICD-10-CM | POA: Diagnosis not present

## 2022-03-15 NOTE — Progress Notes (Signed)
[facility]  Patient ID: Gregory Gilmore,  MRN: [id number]  PCP: Colon Branch, MD  Date of Evaluation: 03/15/2022 Time spent:20 minutes  Chief Complaint:  Chief Complaint   Medication Refill; Patient Education; Follow-up; Anxiety      HISTORY/CURRENT STATUS: HPI  Gregory Gilmore, 47 year old male presents to this office for follow up and medication management.  No psychosocial changes since last visit. Says that his anxiety has much improved and is 1/10. He says his depression is  now 1/10 and has been feeling much better. No panic attacks since last visit which he is very happy about. . Does not want to take additional meds.  He is sleeping 7-8 hours every night. He denies mania, no psychosis, no SI/HI.    Past psychiatric medication trials: Lexapro Zoloft Celexa Wellbutrin Trintellix Klonopin Vraylar Trazodone Ativan Viibryd   Individual Medical History/ Review of Systems: Changes? :No   Allergies: Patient has no known allergies.  Current Medications:  Current Outpatient Medications:    clonazePAM (KLONOPIN) 0.5 MG tablet, Take 1 tablet (0.5 mg total) by mouth 3 (three) times daily as needed for anxiety., Disp: 90 tablet, Rfl: 4   cyclobenzaprine (FLEXERIL) 10 MG tablet, TAKE 1 TABLET BY MOUTH AT BEDTIME AS NEEDED FOR MUSCLE SPASM, Disp: 30 tablet, Rfl: 5   ezetimibe (ZETIA) 10 MG tablet, Take 1 tablet (10 mg total) by mouth at bedtime., Disp: 90 tablet, Rfl: 3   rosuvastatin (CRESTOR) 40 MG tablet, Take 1 tablet (40 mg total) by mouth at bedtime., Disp: 90 tablet, Rfl: 1 Medication Side Effects: none  Family Medical/ Social History: Changes? No  MENTAL HEALTH EXAM:  There were no vitals taken for this visit.There is no height or weight on file to calculate BMI.  General Appearance: Casual, Neat, and Well Groomed  Eye Contact:  Good  Speech:  Clear and Coherent  Volume:  Normal  Mood:  NA  Affect:  Congruent  Thought Process:  Coherent   Orientation:  Full (Time, Place, and Person)  Thought Content: Logical   Suicidal Thoughts:  No  Homicidal Thoughts:  No  Memory:  WNL  Judgement:  Good  Insight:  Good  Psychomotor Activity:  Normal  Concentration:  Concentration: Good  Recall:  Good  Fund of Knowledge: Good  Language: Good  Assets:  Desire for Improvement  ADL's:  Intact  Cognition: WNL  Prognosis:  Good    DIAGNOSES: No diagnosis found.  Receiving Psychotherapy: No    RECOMMENDATIONS:   Greater than 50% of 20 min face to face visit with patient was spent on counseling and coordination of care.Discussed his significant  improvement with anxiety and depression since last visit. Patient stopped all his medication except Klonopin. Stated he did think he needed them anymore. Says he is usually only taking two 0.5 tablets of Klonopin per day and he does not want to take more medication. I educated him of risk of stopping meds cold [place].  We agreed to; Stopped Viibryd to 40 mg daily Stopped  Inderal to 20 mg three times daily for anxiety. If effective  Stopped Vraylar to 3 mg daily Stopped Trazodone 50 mg daily at bedtime Stopped Ativan 0.5 mg three times daily as needed.  Continue Klonopin 0.5 mg three times daily as needed Will report worsening symptoms promptly Provided emergency contact information Discussed potential metabolic side effects associated with atypical antipsychotics, as well as potential risk for movement side effects. Advised pt to contact office if  movement side effects occur.   Discussed potential benefits, risk, and side effects of benzodiazepines to include potential risk of tolerance and dependence, as well as possible drowsiness.  Advised patient not to drive if experiencing drowsiness and to take lowest possible effective dose to minimize risk of dependence and tolerance.  Reviewed PDMP       Elwanda Brooklyn, NP

## 2022-05-17 ENCOUNTER — Telehealth: Payer: Self-pay | Admitting: Behavioral Health

## 2022-05-17 NOTE — Telephone Encounter (Signed)
CMM sent renewal PA request for VRAYLAR 1.5mg , Key [id number]

## 2022-05-17 NOTE — Telephone Encounter (Signed)
This pt is no longer taking Vraylar, per last office note all medications stopped

## 2022-06-21 ENCOUNTER — Encounter: Payer: Self-pay | Admitting: Internal Medicine

## 2022-06-21 ENCOUNTER — Ambulatory Visit (INDEPENDENT_AMBULATORY_CARE_PROVIDER_SITE_OTHER): Payer: BC Managed Care – PPO | Admitting: Internal Medicine

## 2022-06-21 VITALS — BP 118/76 | HR 80 | Ht 74.0 in | Wt 229.0 lb

## 2022-06-21 DIAGNOSIS — R739 Hyperglycemia, unspecified: Secondary | ICD-10-CM

## 2022-06-21 DIAGNOSIS — Z0001 Encounter for general adult medical examination with abnormal findings: Secondary | ICD-10-CM

## 2022-06-21 DIAGNOSIS — Z79899 Other long term (current) drug therapy: Secondary | ICD-10-CM | POA: Diagnosis not present

## 2022-06-21 DIAGNOSIS — E785 Hyperlipidemia, unspecified: Secondary | ICD-10-CM

## 2022-06-21 DIAGNOSIS — Z1211 Encounter for screening for malignant neoplasm of colon: Secondary | ICD-10-CM

## 2022-06-21 DIAGNOSIS — F419 Anxiety disorder, unspecified: Secondary | ICD-10-CM

## 2022-06-21 DIAGNOSIS — F32A Depression, unspecified: Secondary | ICD-10-CM | POA: Diagnosis not present

## 2022-06-21 DIAGNOSIS — Z0000 Encounter for general adult medical examination without abnormal findings: Secondary | ICD-10-CM

## 2022-06-21 LAB — CBC WITH DIFFERENTIAL/PLATELET
Eosinophils Absolute: 58 cells/uL (ref 15–500)
Eosinophils Relative: 0.8 %
HCT: 43.6 % (ref 38.5–50.0)
RBC: 4.56 10*6/uL (ref 4.20–5.80)
Total Lymphocyte: 39.8 %

## 2022-06-21 MED ORDER — ZOLPIDEM TARTRATE 10 MG PO TABS: 5.0000 mg | ORAL_TABLET | Freq: Every evening | ORAL | 1 refills | Status: DC | PRN

## 2022-06-21 NOTE — Progress Notes (Unsigned)
Subjective:    Patient ID: Gregory Gilmore, male    DOB: 1975-11-28, 47 y.o.   MRN: [id number]  DOS:  06/21/2022 Type of visit - description: cpx  Here for CPX In general feels well. Has been working long hours and will continue to do so throughout the summer. Has a long history of difficulty sleeping, request a medication.  Review of Systems  Other than above, a 14 point review of systems is negative     Past Medical History:  Diagnosis Date   Anxiety and depression    GERD (gastroesophageal reflux disease)    Hyperglycemia 07/28/2012   Hyperlipidemia 07/28/2012    Past Surgical History:  Procedure Laterality Date   NO PAST SURGERIES     Social History   Socioeconomic History   Marital status: Married    Spouse name: Not on file   Number of children: 2   Years of education: 12   Highest education level: High school graduate  Occupational History   Occupation: Camera operator    Occupation: Corporate treasurer company  Tobacco Use   Smoking status: Every Day    Packs/day: 1    Types: Cigarettes   Smokeless tobacco: Never   Tobacco comments:    1 ppd   Vaping Use   Vaping Use: Some days   Substances: Nicotine  Substance and Sexual Activity   Alcohol use: Yes    Comment: OCC   Drug use: No   Sexual activity: Yes    Comment: married  Other Topics Concern   Not on file  Social History Narrative   Original from [place], father was Kort Runck a pt of mine deceased ~ 09-Mar-2012   Married, 2 children (one from wife)   finished HS, Airline pilot    Social Determinants of Corporate investment banker Strain: Not on Ship broker Insecurity: Not on file  Transportation Needs: Not on file  Physical Activity: Not on file  Stress: Not on file  Social Connections: Not on file  Intimate Partner Violence: Not on file    Current Outpatient Medications  Medication Instructions   clonazePAM (KLONOPIN) 0.5 mg, Oral, 3 times daily PRN   cyclobenzaprine (FLEXERIL) 10 MG tablet  TAKE 1 TABLET BY MOUTH AT BEDTIME AS NEEDED FOR MUSCLE SPASM   ezetimibe (ZETIA) 10 mg, Oral, Daily at bedtime   rosuvastatin (CRESTOR) 40 mg, Oral, Daily at bedtime   zolpidem (AMBIEN) 5-10 mg, Oral, At bedtime PRN       Objective:   Physical Exam BP 118/76   Pulse 80   Ht 6\' 2"  (1.88 m)   Wt 229 lb (103.9 kg)   SpO2 98%   BMI 29.40 kg/m  General: Well developed, NAD, BMI noted HEENT:  Normocephalic . Face symmetric, atraumatic Lungs:  CTA B Normal respiratory effort, no intercostal retractions, no accessory muscle use. Heart: RRR,  no murmur.  Abdomen:  Not distended, soft, non-tender. No rebound or rigidity.   Lower extremities: no pretibial edema bilaterally  Skin: Exposed areas without rash. Not pale. Not jaundice Neurologic:  alert & oriented X3.  Speech normal, gait appropriate for age and unassisted Strength symmetric and appropriate for age.  Psych: Cognition and judgment appear intact.  Cooperative with normal attention span and concentration.  Behavior appropriate. No anxious or depressed appearing.     Assessment    ASSESSMENT last seen 2015, reestablish 02/18/2017 GERD Depression, GAD, panic attacks, insomnia (intolerant to Trintellix, Lexapro sertraline caused erectile dysfunction) High  cholesterol CP : w/u 2014, 2015, cath 2017 @ [facility] , 2017 EGD >> was concluded sx were d/t panic  CT angio 2015: (-) Sleep study 07-2021: No significant OSA,  Rx dental appliance to decrease his snoring Thoracic back pain: On chronic Flexeril + FH Sarcoidosis, CAD  PLAN Here for CPX, see separate documentation Depression, GAD, panic attacks, insomnia: He has been extremely busy at his job and will remain busy for the next several weeks but overall he is managing stress well. Continue clonazepam 3 times daily Insomnia continues to be an issue, consistent use of melatonin not helping.  Request pharmacological therapy, tried Ambien in the past, try again?  We agreed to  retry Ambien 10 mg half or 1 tablet at night, watch for excessive somnolence.  PDMP okay, prescription sent. RTC 4 months

## 2022-06-21 NOTE — Patient Instructions (Addendum)
Start Ambien 10 mg: Half or 1 tablet at bedtime as needed Stop taking it if you feel too sleepy the next day  HEALTHY SLEEP Sleep hygiene: Basic rules for a good night's sleep  Sleep only as much as you need to feel rested and then get out of bed  Keep a regular sleep schedule  Avoid forcing sleep  Exercise regularly for at least 20 minutes, preferably 4 to 5 hours before bedtime  Avoid caffeinated beverages after lunch  Avoid alcohol near bedtime: no "night cap"  Avoid smoking, especially in the evening  Do not go to bed hungry  Adjust bedroom environment  Avoid prolonged use of light-emitting screens before bedtime   Deal with your worries before bedtime    GO TO THE LAB : Get the blood work     GO TO THE FRONT DESK, PLEASE SCHEDULE YOUR APPOINTMENTS Come back for   checkup in 4 months   Vaccines I recommend: Tetanus booster Flu shot every fall

## 2022-06-22 ENCOUNTER — Encounter: Payer: Self-pay | Admitting: Internal Medicine

## 2022-06-22 LAB — COMPREHENSIVE METABOLIC PANEL
ALT: 36 U/L (ref 9–46)
BUN: 13 mg/dL (ref 7–25)
CO2: 22 mmol/L (ref 20–32)
Globulin: 2.2 g/dL (calc) (ref 1.9–3.7)
Sodium: 138 mmol/L (ref 135–146)

## 2022-06-22 LAB — HEMOGLOBIN A1C: eAG (mmol/L): 6.6 mmol/L

## 2022-06-22 LAB — CBC WITH DIFFERENTIAL/PLATELET
MPV: 11.7 fL (ref 7.5–12.5)
Monocytes Relative: 8.8 %
Neutro Abs: 3622 cells/uL (ref 1500–7800)
RDW: 12.4 % (ref 11.0–15.0)

## 2022-06-22 LAB — LIPID PANEL: Total CHOL/HDL Ratio: 8.4 (calc) — ABNORMAL HIGH (ref <5.0)

## 2022-06-22 NOTE — Assessment & Plan Note (Signed)
-  Tdap 2014, booster recommended -Covid vax: Has declined consistently - Flu shot yearly recommended -CCS: No FH, previous iFOB failed to be returned.  Today request Cologuard >> Rx sent, advised to check with his insurance before he return the sample. -Prostate cancer screening: No increased risk of this year, start  at age 48 -Tobacco: 1 pack a day, not ready to quit. -Labs: see Orders

## 2022-06-22 NOTE — Assessment & Plan Note (Signed)
Here for CPX, see separate documentation Depression, GAD, panic attacks, insomnia: He has been extremely busy at his job and will remain busy for the next several weeks but overall he is managing stress well. Continue clonazepam 3 times daily Insomnia continues to be an issue, consistent use of melatonin not helping.  Request pharmacological therapy, tried Ambien in the past, try again?  We agreed to retry Ambien 10 mg half or 1 tablet at night, watch for excessive somnolence.  PDMP okay, prescription sent. RTC 4 months

## 2022-06-23 ENCOUNTER — Encounter: Payer: Self-pay | Admitting: Internal Medicine

## 2022-06-23 LAB — DRUG MONITORING PANEL 375977 , URINE

## 2022-06-23 LAB — COMPREHENSIVE METABOLIC PANEL
Albumin: 4.3 g/dL (ref 3.6–5.1)
Alkaline phosphatase (APISO): 78 U/L (ref 36–130)
Calcium: 9 mg/dL (ref 8.6–10.3)
Total Protein: 6.5 g/dL (ref 6.1–8.1)

## 2022-06-23 LAB — CBC WITH DIFFERENTIAL/PLATELET
MCH: 32.7 pg (ref 27.0–33.0)
WBC: 7.2 10*3/uL (ref 3.8–10.8)

## 2022-06-23 LAB — HEMOGLOBIN A1C: Mean Plasma Glucose: 120 mg/dL

## 2022-06-24 ENCOUNTER — Telehealth: Payer: Self-pay | Admitting: Internal Medicine

## 2022-06-24 DIAGNOSIS — F411 Generalized anxiety disorder: Secondary | ICD-10-CM

## 2022-06-24 LAB — COMPREHENSIVE METABOLIC PANEL
AG Ratio: 2 (calc) (ref 1.0–2.5)
AST: 23 U/L (ref 10–40)
Chloride: 104 mmol/L (ref 98–110)
Creat: 0.94 mg/dL (ref 0.60–1.29)
Glucose, Bld: 82 mg/dL (ref 65–99)
Potassium: 3.9 mmol/L (ref 3.5–5.3)
Total Bilirubin: 0.9 mg/dL (ref 0.2–1.2)

## 2022-06-24 LAB — HEMOGLOBIN A1C: Hgb A1c MFr Bld: 5.8 % of total Hgb — ABNORMAL HIGH (ref <5.7)

## 2022-06-24 LAB — DRUG MONITORING PANEL 375977 , URINE
Alcohol Metabolites: NEGATIVE ng/mL (ref <500)
Alphahydroxyalprazolam: NEGATIVE ng/mL (ref <25)
Alphahydroxymidazolam: NEGATIVE ng/mL (ref <50)
Alphahydroxytriazolam: NEGATIVE ng/mL (ref <50)
Aminoclonazepam: 444 ng/mL — ABNORMAL HIGH (ref <25)
Barbiturates: NEGATIVE ng/mL (ref <300)
Benzodiazepines: POSITIVE ng/mL — AB (ref <100)
Cocaine Metabolite: NEGATIVE ng/mL (ref <150)
Desmethyltramadol: NEGATIVE ng/mL (ref <100)
Hydroxyethylflurazepam: NEGATIVE ng/mL (ref <50)
Lorazepam: NEGATIVE ng/mL (ref <50)
Marijuana Metabolite: 512 ng/mL — ABNORMAL HIGH (ref <5)
Marijuana Metabolite: POSITIVE ng/mL — AB (ref <20)
Nordiazepam: NEGATIVE ng/mL (ref <50)
Opiates: NEGATIVE ng/mL (ref <100)
Oxazepam: NEGATIVE ng/mL (ref <50)
Oxycodone: NEGATIVE ng/mL (ref <100)
Temazepam: NEGATIVE ng/mL (ref <50)
Tramadol: NEGATIVE ng/mL (ref <100)

## 2022-06-24 LAB — LIPID PANEL
Cholesterol: 253 mg/dL — ABNORMAL HIGH (ref <200)
HDL: 30 mg/dL — ABNORMAL LOW (ref >=40)
LDL Cholesterol (Calc): 189 mg/dL (calc) — ABNORMAL HIGH
Non-HDL Cholesterol (Calc): 223 mg/dL (calc) — ABNORMAL HIGH (ref <130)
Triglycerides: 172 mg/dL — ABNORMAL HIGH (ref <150)

## 2022-06-24 LAB — CBC WITH DIFFERENTIAL/PLATELET
Absolute Monocytes: 634 cells/uL (ref 200–950)
Basophils Absolute: 22 cells/uL (ref 0–200)
Basophils Relative: 0.3 %
Hemoglobin: 14.9 g/dL (ref 13.2–17.1)
Lymphs Abs: 2866 cells/uL (ref 850–3900)
MCHC: 34.2 g/dL (ref 32.0–36.0)
MCV: 95.6 fL (ref 80.0–100.0)
Neutrophils Relative %: 50.3 %
Platelets: 236 10*3/uL (ref 140–400)

## 2022-06-24 LAB — DM TEMPLATE

## 2022-06-24 NOTE — Telephone Encounter (Signed)
Requesting: clonazepam 0.5mg   Contract: 06/21/22 UDS: 06/21/22 Last Visit: 06/21/22 Next Visit: 10/22/22 Last Refill: 01/14/22 #90 and 4RF  Please Advise

## 2022-06-24 NOTE — Telephone Encounter (Signed)
PDMP okay, Rx sent 

## 2022-06-26 MED ORDER — ROSUVASTATIN CALCIUM 40 MG PO TABS: 40.0000 mg | ORAL_TABLET | Freq: Every day | ORAL | 3 refills | Status: DC

## 2022-06-26 MED ORDER — EZETIMIBE 10 MG PO TABS: 10.0000 mg | ORAL_TABLET | Freq: Every day | ORAL | 3 refills | Status: DC

## 2022-06-26 NOTE — Addendum Note (Signed)
Addended byConrad [place] D on: 06/26/2022 03:55 PM   Modules accepted: Orders

## 2022-07-04 ENCOUNTER — Telehealth: Payer: Self-pay | Admitting: Internal Medicine

## 2022-07-04 MED ORDER — EZETIMIBE 10 MG PO TABS: 10.0000 mg | ORAL_TABLET | Freq: Every day | ORAL | 3 refills | Status: DC

## 2022-07-04 MED ORDER — ROSUVASTATIN CALCIUM 40 MG PO TABS: 40.0000 mg | ORAL_TABLET | Freq: Every day | ORAL | 3 refills | Status: DC

## 2022-07-04 NOTE — Telephone Encounter (Signed)
Pt said he needs all of his prescriptions that were called in in May to be sent to [facility] in [facility]. Pt would like future refills to be sent there as well.

## 2022-07-04 NOTE — Addendum Note (Signed)
Addended byConrad [place] D on: 07/04/2022 02:04 PM   Modules accepted: Orders

## 2022-07-04 NOTE — Telephone Encounter (Signed)
Rxs sent

## 2022-08-07 ENCOUNTER — Telehealth: Payer: Self-pay | Admitting: Internal Medicine

## 2022-08-07 DIAGNOSIS — F411 Generalized anxiety disorder: Secondary | ICD-10-CM

## 2022-08-07 MED ORDER — CYCLOBENZAPRINE HCL 10 MG PO TABS: ORAL_TABLET | ORAL | 5 refills | Status: DC

## 2022-08-07 MED ORDER — CLONAZEPAM 0.5 MG PO TABS
0.5000 mg | ORAL_TABLET | Freq: Three times a day (TID) | ORAL | 3 refills | Status: DC | PRN
Start: 2022-08-07 — End: 2023-04-21

## 2022-08-07 NOTE — Addendum Note (Signed)
Addended byConrad [place] D on: 08/07/2022 12:32 PM   Modules accepted: Orders

## 2022-08-07 NOTE — Telephone Encounter (Signed)
Patient called would like meds sent to [facility] [address] [place]  clonazePAM (KLONOPIN) 0.5 MG tablet  cyclobenzaprine (FLEXERIL) 10 MG tablet    He states he will be going out of town and needs meds prior to Saturday.

## 2022-08-07 NOTE — Telephone Encounter (Signed)
Rx sent for flexeril.   Dr. Drue Novel- Pt is requesting refill of clonazepam sent to a new pharmacy. Last refilled on 06/24/22 #90 and 3RF.   Requesting to be sent to [facility] in [facility].

## 2022-08-07 NOTE — Addendum Note (Signed)
Addended by: Willow Ora E on: 08/07/2022 01:48 PM   Modules accepted: Orders

## 2022-08-07 NOTE — Telephone Encounter (Signed)
PDMP okay, Rx sent 

## 2022-08-19 ENCOUNTER — Telehealth: Payer: Self-pay | Admitting: Internal Medicine

## 2022-08-19 MED ORDER — ZOLPIDEM TARTRATE 10 MG PO TABS: 5.0000 mg | ORAL_TABLET | Freq: Every evening | ORAL | 0 refills | Status: DC | PRN

## 2022-08-19 NOTE — Addendum Note (Signed)
Addended by: Willow Ora E on: 08/19/2022 11:31 AM   Modules accepted: Orders

## 2022-08-19 NOTE — Telephone Encounter (Signed)
PDMP reviewed, prescription sent 

## 2022-08-19 NOTE — Addendum Note (Signed)
Addended byConrad [place] D on: 08/19/2022 09:27 AM   Modules accepted: Orders

## 2022-08-19 NOTE — Telephone Encounter (Signed)
Patient called and needs Ambien sent to [facility] rather than to the [facility]. Thank you !

## 2022-08-19 NOTE — Telephone Encounter (Signed)
Requesting: Ambien 10mg   Contract: 06/21/22 UDS: 06/21/22 Last Visit: 06/21/22 Next Visit: 10/22/22 Last Refill: 06/21/22 #30 and 1RF  New pharmacy  Please Advise

## 2022-09-13 ENCOUNTER — Ambulatory Visit: Payer: BC Managed Care – PPO | Admitting: Behavioral Health

## 2022-09-19 ENCOUNTER — Encounter (INDEPENDENT_AMBULATORY_CARE_PROVIDER_SITE_OTHER): Payer: Self-pay

## 2022-10-03 DIAGNOSIS — L814 Other melanin hyperpigmentation: Secondary | ICD-10-CM | POA: Diagnosis not present

## 2022-10-03 DIAGNOSIS — L820 Inflamed seborrheic keratosis: Secondary | ICD-10-CM | POA: Diagnosis not present

## 2022-10-03 DIAGNOSIS — D224 Melanocytic nevi of scalp and neck: Secondary | ICD-10-CM | POA: Diagnosis not present

## 2022-10-04 ENCOUNTER — Ambulatory Visit (INDEPENDENT_AMBULATORY_CARE_PROVIDER_SITE_OTHER): Payer: BC Managed Care – PPO | Admitting: Behavioral Health

## 2022-10-04 ENCOUNTER — Encounter: Payer: Self-pay | Admitting: Behavioral Health

## 2022-10-04 DIAGNOSIS — F411 Generalized anxiety disorder: Secondary | ICD-10-CM | POA: Diagnosis not present

## 2022-10-04 NOTE — Progress Notes (Signed)
[facility]  Patient ID: Gregory Gilmore,  MRN: [id number]  PCP: Wanda Plump, MD  Date of Evaluation: 10/04/2022 Time spent:30 minutes  Chief Complaint:  Chief Complaint   Anxiety; Depression; Follow-up; Patient Education     HISTORY/CURRENT STATUS: HPI  Gregory Gilmore, 47 year old male presents to this office for follow up and medication management.  No psychosocial changes since last visit. Says that his anxiety has much improved and is 1/10. He says his depression is  now 1/10 and has been feeling much better. No panic attacks since last visit which he is very happy about. . Does not want to take additional meds. Requesting 1 year follow up.  He is sleeping 7-8 hours every night. He denies mania, no psychosis, no SI/HI.    Past psychiatric medication trials: Lexapro Zoloft Celexa Wellbutrin Trintellix Klonopin Vraylar Trazodone Ativan Viibryd        Individual Medical History/ Review of Systems: Changes? :No   Allergies: Patient has no known allergies.  Current Medications:  Current Outpatient Medications:    clonazePAM (KLONOPIN) 0.5 MG tablet, Take 1 tablet (0.5 mg total) by mouth 3 (three) times daily as needed for anxiety., Disp: 90 tablet, Rfl: 3   cyclobenzaprine (FLEXERIL) 10 MG tablet, TAKE 1 TABLET BY MOUTH AT BEDTIME AS NEEDED FOR MUSCLE SPASM, Disp: 30 tablet, Rfl: 5   ezetimibe (ZETIA) 10 MG tablet, Take 1 tablet (10 mg total) by mouth at bedtime., Disp: 90 tablet, Rfl: 3   rosuvastatin (CRESTOR) 40 MG tablet, Take 1 tablet (40 mg total) by mouth at bedtime., Disp: 90 tablet, Rfl: 3   zolpidem (AMBIEN) 10 MG tablet, Take 0.5-1 tablets (5-10 mg total) by mouth at bedtime as needed for sleep., Disp: 30 tablet, Rfl: 0 Medication Side Effects: none  Family Medical/ Social History: Changes? No  MENTAL HEALTH EXAM:  There were no vitals taken for this visit.There is no height or weight on file to calculate BMI.  General Appearance: Casual  and Well Groomed  Eye Contact:  Good  Speech:  Clear and Coherent  Volume:  Normal  Mood:  NA  Affect:  Appropriate  Thought Process:  Coherent  Orientation:  Full (Time, Place, and Person)  Thought Content: Logical   Suicidal Thoughts:  No  Homicidal Thoughts:  No  Memory:  WNL  Judgement:  Good  Insight:  Good  Psychomotor Activity:  Normal  Concentration:  Concentration: Good  Recall:  Good  Fund of Knowledge: Good  Language: Good  Assets:  Desire for Improvement  ADL's:  Intact  Cognition: WNL  Prognosis:  Good    DIAGNOSES:    ICD-10-CM   1. Generalized anxiety disorder  F41.1       Receiving Psychotherapy: No    RECOMMENDATIONS:  Greater than 50% of 20 min face to face visit with patient was spent on counseling and coordination of care.Discussed his significant  improvement with anxiety and depression since last visit. Patient stopped all his medication except Klonopin. Stated he did think he needed them anymore. Says he is usually only taking two 0.5 tablets of Klonopin per day and he does not want to take more medication. I educated him of risk of stopping meds cold [place].  We agreed to; Continue Klonopin 0.5 mg three times daily as needed. RX by Dr. Drue Novel Will report worsening symptoms promptly Provided emergency contact information Discussed potential metabolic side effects associated with atypical antipsychotics, as well as potential risk for movement side  effects. Advised pt to contact office if movement side effects occur.   Discussed potential benefits, risk, and side effects of benzodiazepines to include potential risk of tolerance and dependence, as well as possible drowsiness.  Advised patient not to drive if experiencing drowsiness and to take lowest possible effective dose to minimize risk of dependence and tolerance.  Reviewed PDMP                 Joan Flores, NP

## 2022-10-16 ENCOUNTER — Telehealth: Payer: Self-pay | Admitting: Internal Medicine

## 2022-10-16 NOTE — Telephone Encounter (Signed)
Requesting: Ambien 10mg   Contract: 07/04/22 UDS: 06/21/22 Last Visit: 06/21/22 Next Visit:10/22/22 Last Refill: 08/19/22 #30 and 0RF   Please Advise

## 2022-10-16 NOTE — Telephone Encounter (Signed)
PDMP okay, Rx sent 

## 2022-10-22 ENCOUNTER — Ambulatory Visit: Payer: BC Managed Care – PPO | Admitting: Internal Medicine

## 2022-10-22 ENCOUNTER — Encounter: Payer: Self-pay | Admitting: Internal Medicine

## 2022-10-22 VITALS — BP 124/68 | HR 74 | Temp 98.3°F | Resp 16 | Ht 74.0 in | Wt 227.5 lb

## 2022-10-22 DIAGNOSIS — R5383 Other fatigue: Secondary | ICD-10-CM

## 2022-10-22 DIAGNOSIS — E785 Hyperlipidemia, unspecified: Secondary | ICD-10-CM

## 2022-10-22 DIAGNOSIS — F32A Depression, unspecified: Secondary | ICD-10-CM | POA: Diagnosis not present

## 2022-10-22 DIAGNOSIS — F419 Anxiety disorder, unspecified: Secondary | ICD-10-CM

## 2022-10-22 NOTE — Patient Instructions (Addendum)
Vaccines I recommend: Tdap (tetanus) Flu shot this fall Covid booster- new this fall   GO TO THE LAB : Get the blood work     Next visit with me  in 6 months , routine check up    Please schedule it at the front desk

## 2022-10-22 NOTE — Progress Notes (Unsigned)
Subjective:    Patient ID: Gregory Gilmore, male    DOB: April 01, 1975, 47 y.o.   MRN: [id number]  DOS:  10/22/2022 Type of visit - description: Routine follow-up.  No new concerns. Good compliance with cholesterol medication.   Review of Systems See above   Past Medical History:  Diagnosis Date   Anxiety and depression    GERD (gastroesophageal reflux disease)    Hyperglycemia 07/28/2012   Hyperlipidemia 07/28/2012    Past Surgical History:  Procedure Laterality Date   NO PAST SURGERIES      Current Outpatient Medications  Medication Instructions   clonazePAM (KLONOPIN) 0.5 mg, Oral, 3 times daily PRN   cyclobenzaprine (FLEXERIL) 10 MG tablet TAKE 1 TABLET BY MOUTH AT BEDTIME AS NEEDED FOR MUSCLE SPASM   ezetimibe (ZETIA) 10 mg, Oral, Daily at bedtime   rosuvastatin (CRESTOR) 40 mg, Oral, Daily at bedtime   zolpidem (AMBIEN) 10 MG tablet TAKE ONE-HALF TO ONE TABLET BY MOUTH AT BEDTIME AS NEEDED FOR SLEEP       Objective:   Physical Exam BP 124/68   Pulse 74   Temp 98.3 F (36.8 C) (Oral)   Resp 16   Ht 6\' 2"  (1.88 m)   Wt 227 lb 8 oz (103.2 kg)   SpO2 94%   BMI 29.21 kg/m  General:   Well developed, NAD, BMI noted. HEENT:  Normocephalic . Face symmetric, atraumatic Lungs:  CTA B Normal respiratory effort, no intercostal retractions, no accessory muscle use. Heart: RRR,  no murmur.  Lower extremities: no pretibial edema bilaterally  Skin: Not pale. Not jaundice Neurologic:  alert & oriented X3.  Speech normal, gait appropriate for age and unassisted Psych--  Cognition and judgment appear intact.  Cooperative with normal attention span and concentration.  Behavior appropriate. No anxious or depressed appearing.      Assessment     ASSESSMENT last seen 2015, reestablish 02/18/2017 GERD Depression, GAD, panic attacks, insomnia (intolerant to Trintellix, Lexapro sertraline caused erectile dysfunction) High cholesterol CP : w/u 2014, 2015, cath 2017 @  [facility] , 2017 EGD >> was concluded sx were d/t panic  CT angio 2015: (-) Sleep study 07-2021: No significant OSA,  Rx dental appliance to decrease his snoring Thoracic back pain: On chronic Flexeril + FH Sarcoidosis, CAD  PLAN Depression, GAD, panic attacks, insomnia: Saw his counselor 10/04/2022, at that time he said he was doing great and sleeping for 8 hours at night. Today he tells me something different >> continue with anxiety, only sleeping 4 hours a day. Not sure what to make out of this discrepancy.  Continue Ambien, clonazepam.  Snoring fatigue  Unchanged, UDS show THC, states he takes OTC supplement with it.  I am not in favor of those supplements in his case. For completeness we will check vitamin levels. Checking testosterone?  I am not opposed, will do on RTC if he comes back early in the morning. High cholesterol: Last LDL quite elevated, he was not taking rosuvastatin or Zetia regularly, he is doing that now.  Check labs. Vaccine advised provided, he declines. RTC 6 months.

## 2022-10-23 NOTE — Assessment & Plan Note (Signed)
Depression, GAD, panic attacks, insomnia: Reports ongoing symptoms with anxiety, only sleeping 4 hours a day. For now we agreed to continue Ambien and clonazepam. (After the patient left I had the opportunity to see the note from counselor 10/04/2022, at the time reported he  was doing great.  Unclear what to make of this discrepancy). Snoring fatigue  Unchanged, UDS show THC, states he takes OTC supplement with it.  I am not in favor of those supplements in his case. For completeness we will check vitamin levels. Checking testosterone?  I am not opposed, will do on RTC if he comes back early in the morning. High cholesterol: Last LDL quite elevated, he was not taking rosuvastatin or Zetia regularly, he is doing that now.  Check labs. Vaccine advise provided, he declines. RTC 6 months.

## 2022-10-24 NOTE — Addendum Note (Signed)
Addended byConrad [place] D on: 10/24/2022 03:37 PM   Modules accepted: Orders

## 2022-12-14 DIAGNOSIS — R109 Unspecified abdominal pain: Secondary | ICD-10-CM | POA: Diagnosis not present

## 2022-12-14 DIAGNOSIS — M62838 Other muscle spasm: Secondary | ICD-10-CM | POA: Diagnosis not present

## 2022-12-16 ENCOUNTER — Encounter: Payer: Self-pay | Admitting: Internal Medicine

## 2022-12-16 ENCOUNTER — Ambulatory Visit: Payer: BC Managed Care – PPO | Admitting: Internal Medicine

## 2022-12-16 VITALS — BP 116/60 | HR 78 | Temp 97.9°F | Resp 16 | Ht 74.0 in | Wt 227.5 lb

## 2022-12-16 DIAGNOSIS — R109 Unspecified abdominal pain: Secondary | ICD-10-CM

## 2022-12-16 DIAGNOSIS — F32A Depression, unspecified: Secondary | ICD-10-CM

## 2022-12-16 DIAGNOSIS — Z79899 Other long term (current) drug therapy: Secondary | ICD-10-CM

## 2022-12-16 DIAGNOSIS — F419 Anxiety disorder, unspecified: Secondary | ICD-10-CM | POA: Diagnosis not present

## 2022-12-16 LAB — CBC WITH DIFFERENTIAL/PLATELET
Basophils Absolute: 0 10*3/uL (ref 0.0–0.1)
Basophils Relative: 0.5 % (ref 0.0–3.0)
Eosinophils Absolute: 0.1 10*3/uL (ref 0.0–0.7)
Eosinophils Relative: 0.9 % (ref 0.0–5.0)
HCT: 47 % (ref 39.0–52.0)
Hemoglobin: 16 g/dL (ref 13.0–17.0)
Lymphocytes Relative: 38.2 % (ref 12.0–46.0)
Lymphs Abs: 2.6 10*3/uL (ref 0.7–4.0)
MCHC: 33.9 g/dL (ref 30.0–36.0)
MCV: 99.1 fL (ref 78.0–100.0)
Monocytes Absolute: 0.5 10*3/uL (ref 0.1–1.0)
Monocytes Relative: 6.7 % (ref 3.0–12.0)
Neutro Abs: 3.6 10*3/uL (ref 1.4–7.7)
Neutrophils Relative %: 53.7 % (ref 43.0–77.0)
Platelets: 243 10*3/uL (ref 150.0–400.0)
RBC: 4.75 Mil/uL (ref 4.22–5.81)
RDW: 13.4 % (ref 11.5–15.5)
WBC: 6.8 10*3/uL (ref 4.0–10.5)

## 2022-12-16 LAB — BASIC METABOLIC PANEL
BUN: 14 mg/dL (ref 6–23)
CO2: 27 meq/L (ref 19–32)
Calcium: 9.5 mg/dL (ref 8.4–10.5)
Chloride: 103 meq/L (ref 96–112)
Creatinine, Ser: 0.93 mg/dL (ref 0.40–1.50)
GFR: 97.27 mL/min (ref >60.00)
Glucose, Bld: 88 mg/dL (ref 70–99)
Potassium: 4.4 meq/L (ref 3.5–5.1)
Sodium: 139 meq/L (ref 135–145)

## 2022-12-16 NOTE — Patient Instructions (Signed)
Proceed with blood work today.  Will schedule a CT scan of the abdomen and pelvis.

## 2022-12-16 NOTE — Assessment & Plan Note (Addendum)
SQ lumps, flank pain, lower abdominal pain. Presents with above symptoms.  He is extremely concerned about this representing major medical problem such as cancer, requested further evaluation. The subcutaneous lumps are likely benign lipomas. As far as flank and abdominal pain, ROS is benign. Plan: BMP, CBC, CT abdomen and pelvis with and without. If negative I would rec observation although at the end of the visit he said that at times the pain radiates down to the left leg.  Ortho referral ?. High cholesterol: Last total cholesterol was 93, still taking Zetia, is okay to stop it and  stay on rosuvastatin Depression, GAD, panic attacks, insomnia: Suspect anxiety is playing a role on how he feels

## 2022-12-16 NOTE — Addendum Note (Signed)
Addended byConrad [place] D on: 12/16/2022 04:44 PM   Modules accepted: Orders

## 2022-12-16 NOTE — Progress Notes (Signed)
Subjective:    Patient ID: Gregory Gilmore, male    DOB: 04-04-1975, 47 y.o.   MRN: [id number]  DOS:  12/16/2022 Type of visit - description: Acute, here with his wife.  About a week ago developed pain at the left flank. He tells me the pain is located where he noted a lump. He is otherwise bilateral, chronic back pain seems to be at baseline.  D/t above sxs went to a urgent care 12/14/2022 Urinalysis negative. Rx Norflex.  He is here because the pain at the left flank is ongoing,  he has also found one additional lump at the left lower quadrant of the abdomen and he is extremely anxious about the whole situation..   No fever or chills. Denies any injury or rash. When asked, admits to some abdominal discomfort, lower abdomen. Denies nausea vomiting.  No blood in the stools. Normal appetite. No blood in the urine. No rash.    Review of Systems See above   Past Medical History:  Diagnosis Date   Anxiety and depression    GERD (gastroesophageal reflux disease)    Hyperglycemia 07/28/2012   Hyperlipidemia 07/28/2012    Past Surgical History:  Procedure Laterality Date   NO PAST SURGERIES      Current Outpatient Medications  Medication Instructions   clonazePAM (KLONOPIN) 0.5 mg, Oral, 3 times daily PRN   cyclobenzaprine (FLEXERIL) 10 MG tablet TAKE 1 TABLET BY MOUTH AT BEDTIME AS NEEDED FOR MUSCLE SPASM   ezetimibe (ZETIA) 10 mg, Oral, Daily   orphenadrine (NORFLEX) 100 mg, Oral, 2 times daily PRN   rosuvastatin (CRESTOR) 40 mg, Oral, Daily at bedtime   zolpidem (AMBIEN) 10 MG tablet TAKE ONE-HALF TO ONE TABLET BY MOUTH AT BEDTIME AS NEEDED FOR SLEEP       Objective:   Physical Exam Skin:         Comments: At the Left flank and  LLQ abdomen has SQ lumps, ~ 1.5 to 2 cm in size, slightly TTP.  The skin is not red, no openings.  No warmness.  BP 116/60   Pulse 78   Temp 97.9 F (36.6 C) (Oral)   Resp 16   Ht 6\' 2"  (1.88 m)   Wt 227 lb 8 oz (103.2 kg)    SpO2 97%   BMI 29.21 kg/m  General:   Well developed, NAD, BMI noted.  HEENT:  Normocephalic . Face symmetric, atraumatic Lymphatic system: No LAD's at the neck, armpits or groins Abdomen:  Not distended, soft, slightly tender at the lower abdomen without mass or rebound L>R. Abdominal wall: See graphic. Skin: Not pale. Not jaundice Lower extremities: no pretibial edema bilaterally  Neurologic:  alert & oriented X3.  Speech normal, gait appropriate for age and unassisted Psych--  Cognition and judgment appear intact.  Cooperative with normal attention span and concentration.  Behavior appropriate. No anxious or depressed appearing.     Assessment      ASSESSMENT last seen 2015, reestablish 02/18/2017 GERD Depression, GAD, panic attacks, insomnia (intolerant to Trintellix, Lexapro sertraline caused erectile dysfunction) High cholesterol CP : w/u 2014, 2015, cath 2017 @ [facility] , 2017 EGD >> was concluded sx were d/t panic  CT angio 2015: (-) Sleep study 07-2021: No significant OSA,  Rx dental appliance to decrease his snoring Thoracic back pain: On chronic Flexeril + FH Sarcoidosis, CAD  PLAN SQ lumps, flank pain, lower abdominal pain. Presents with above symptoms.  He is extremely concerned about this representing major medical  problem such as cancer, requested further evaluation. The subcutaneous lumps are likely benign lipomas. As far as flank and abdominal pain, ROS is benign. Plan: BMP, CBC, CT abdomen and pelvis with and without. If negative I would rec observation although at the end of the visit he said that at times the pain radiates down to the left leg.  Ortho referral ?. High cholesterol: Last total cholesterol was 93, still taking Zetia, is okay to stop it and  stay on rosuvastatin Depression, GAD, panic attacks, insomnia: Suspect anxiety is playing a role on how he feels

## 2022-12-17 ENCOUNTER — Ambulatory Visit
Admission: RE | Admit: 2022-12-17 | Discharge: 2022-12-17 | Disposition: A | Discharge status: Home / Self Care | Payer: BC Managed Care – PPO | Source: Ambulatory Visit | Attending: Internal Medicine | Admitting: Internal Medicine

## 2022-12-17 DIAGNOSIS — R109 Unspecified abdominal pain: Secondary | ICD-10-CM | POA: Diagnosis not present

## 2022-12-17 MED ORDER — IOPAMIDOL (ISOVUE-370) INJECTION 76%
500.0000 mL | Freq: Once | INTRAVENOUS | Status: AC | PRN
  Administered 2022-12-17: 85 mL via INTRAVENOUS

## 2022-12-17 NOTE — Addendum Note (Signed)
Addended byConrad [place] D on: 12/17/2022 09:01 AM   Modules accepted: Orders

## 2022-12-17 NOTE — Addendum Note (Signed)
Addended byConrad [place] D on: 12/17/2022 08:44 AM   Modules accepted: Orders

## 2022-12-18 LAB — DRUG MONITORING PANEL 375977 , URINE
Alcohol Metabolites: NEGATIVE ng/mL (ref <500)
Alphahydroxyalprazolam: NEGATIVE ng/mL (ref <25)
Alphahydroxymidazolam: NEGATIVE ng/mL (ref <50)
Alphahydroxytriazolam: NEGATIVE ng/mL (ref <50)
Aminoclonazepam: 217 ng/mL — ABNORMAL HIGH (ref <25)
Amphetamines: NEGATIVE ng/mL (ref <500)
Barbiturates: NEGATIVE ng/mL (ref <300)
Benzodiazepines: POSITIVE ng/mL — AB (ref <100)
Cocaine Metabolite: NEGATIVE ng/mL (ref <150)
Desmethyltramadol: NEGATIVE ng/mL (ref <100)
Hydroxyethylflurazepam: NEGATIVE ng/mL (ref <50)
Lorazepam: NEGATIVE ng/mL (ref <50)
Marijuana Metabolite: 254 ng/mL — ABNORMAL HIGH (ref <5)
Marijuana Metabolite: POSITIVE ng/mL — AB (ref <20)
Nordiazepam: NEGATIVE ng/mL (ref <50)
Opiates: NEGATIVE ng/mL (ref <100)
Oxazepam: NEGATIVE ng/mL (ref <50)
Oxycodone: NEGATIVE ng/mL (ref <100)
Temazepam: NEGATIVE ng/mL (ref <50)
Tramadol: NEGATIVE ng/mL (ref <100)

## 2022-12-18 LAB — DM TEMPLATE

## 2023-01-13 ENCOUNTER — Telehealth: Payer: Self-pay | Admitting: Internal Medicine

## 2023-01-13 NOTE — Telephone Encounter (Signed)
He had left flank pain, and two SQ nodules.  CT scan was benign.  If the subcutaneous nodules are  tender, please refer to general surgery for consideration of excision. Let him know

## 2023-01-13 NOTE — Telephone Encounter (Signed)
Please advise 

## 2023-01-13 NOTE — Telephone Encounter (Signed)
Spoke w/ Pt- informed of PCP recommendations. He wants to know what he can do to make them go down. He is worried that they may bust? Never mentioned if they are still painful or not. Informed I'd have PCP call back for his additional questions.

## 2023-01-13 NOTE — Telephone Encounter (Signed)
Spoke with the patient, the subcutaneous nodules bother him. He could simply watch them or ask a general surgeon to remove them. States that will talk with his wife and let me know if he is interested on a referral

## 2023-01-13 NOTE — Telephone Encounter (Signed)
Pt called stating that he had not heard what to do next regarding his cyst that he was seen for on 11.11.24. Pt stated that it is getting bigger and causing more pain as time goes on. Per Chart review, did not see any info regarding a follow up w/ Dr. Drue Novel or a referral placed. Please Advise.

## 2023-04-21 ENCOUNTER — Ambulatory Visit: Payer: BC Managed Care – PPO | Admitting: Internal Medicine

## 2023-04-21 ENCOUNTER — Encounter: Payer: Self-pay | Admitting: Internal Medicine

## 2023-04-21 VITALS — BP 120/70 | HR 81 | Temp 98.2°F | Resp 16 | Ht 74.0 in | Wt 237.0 lb

## 2023-04-21 DIAGNOSIS — R739 Hyperglycemia, unspecified: Secondary | ICD-10-CM

## 2023-04-21 DIAGNOSIS — F411 Generalized anxiety disorder: Secondary | ICD-10-CM

## 2023-04-21 DIAGNOSIS — E785 Hyperlipidemia, unspecified: Secondary | ICD-10-CM | POA: Diagnosis not present

## 2023-04-21 LAB — LIPID PANEL
Cholesterol: 270 mg/dL — ABNORMAL HIGH (ref 0–200)
HDL: 38.9 mg/dL — ABNORMAL LOW (ref >39.00)
LDL Cholesterol: 199 mg/dL — ABNORMAL HIGH (ref 0–99)
NonHDL: 230.67
Total CHOL/HDL Ratio: 7
Triglycerides: 157 mg/dL — ABNORMAL HIGH (ref 0.0–149.0)
VLDL: 31.4 mg/dL (ref 0.0–40.0)

## 2023-04-21 LAB — HEMOGLOBIN A1C: Hgb A1c MFr Bld: 5.9 % (ref 4.6–6.5)

## 2023-04-21 MED ORDER — ROSUVASTATIN CALCIUM 40 MG PO TABS: 40.0000 mg | ORAL_TABLET | Freq: Every day | ORAL | 3 refills | Status: AC

## 2023-04-21 MED ORDER — CLONAZEPAM 0.5 MG PO TABS: 0.5000 mg | ORAL_TABLET | Freq: Three times a day (TID) | ORAL | 3 refills | Status: DC | PRN

## 2023-04-21 MED ORDER — ZOLPIDEM TARTRATE 10 MG PO TABS: 5.0000 mg | ORAL_TABLET | Freq: Every evening | ORAL | 1 refills | Status: DC | PRN

## 2023-04-21 NOTE — Progress Notes (Signed)
   Subjective:    Patient ID: Gregory Gilmore, male    DOB: September 30, 1975, 48 y.o.   MRN: [id number]  DOS:  04/21/2023 Type of visit - description: f/u  Follow-up from last visit. In general states feels very good. Still have from time to time pain at the left flank, often times when he bends over.  Review of Systems See above   Past Medical History:  Diagnosis Date   Anxiety and depression    GERD (gastroesophageal reflux disease)    Hyperglycemia 07/28/2012   Hyperlipidemia 07/28/2012    Past Surgical History:  Procedure Laterality Date   NO PAST SURGERIES      Current Outpatient Medications  Medication Instructions   clonazePAM (KLONOPIN) 0.5 mg, Oral, 3 times daily PRN   cyclobenzaprine (FLEXERIL) 10 MG tablet TAKE 1 TABLET BY MOUTH AT BEDTIME AS NEEDED FOR MUSCLE SPASM   rosuvastatin (CRESTOR) 40 mg, Oral, Daily at bedtime   zolpidem (AMBIEN) 10 MG tablet TAKE ONE-HALF TO ONE TABLET BY MOUTH AT BEDTIME AS NEEDED FOR SLEEP       Objective:   Physical Exam BP 120/70   Pulse 81   Temp 98.2 F (36.8 C) (Oral)   Resp 16   Ht 6\' 2"  (1.88 m)   Wt 237 lb (107.5 kg)   SpO2 95%   BMI 30.43 kg/m  General:   Well developed, NAD, BMI noted. HEENT:  Normocephalic . Face symmetric, atraumatic Lungs:  CTA B Normal respiratory effort, no intercostal retractions, no accessory muscle use. Heart: RRR,  no murmur.  Lower extremities: no pretibial edema bilaterally  Skin: Not pale. Not jaundice Neurologic:  alert & oriented X3.  Speech normal, gait appropriate for age and unassisted Psych--  Cognition and judgment appear intact.  Cooperative with normal attention span and concentration.  Behavior appropriate. No anxious or depressed appearing.      Assessment       ASSESSMENT last seen 2015, reestablish 02/18/2017 GERD Depression, GAD, panic attacks, insomnia (intolerant to Trintellix, Lexapro sertraline caused erectile dysfunction) High cholesterol CP : w/u 2014,  2015, cath 2017 @ [facility] , 2017 EGD >> was concluded sx were d/t panic  CT angio 2015: (-) Sleep study 07-2021: No significant OSA,  Rx dental appliance to decrease his snoring Thoracic back pain: On chronic Flexeril + FH Sarcoidosis, CAD  PLAN SQ lumps, flank pain, lower abdominal pain. See LOV, CT abdomen show aortic sclerosis and a R simple renal cyst, no indication for follow-up.  Still has occasional L flank pain.  Again offered to Ortho referral otherwise observation. Depression, GAD, panic attacks, insomnia: Well-controlled, PDMP okay, RF clonazepam and Ambien. High cholesterol: Last LFTs normal, continue Crestor, check FLP. RTC 4 months CPX

## 2023-04-21 NOTE — Patient Instructions (Addendum)
   GO TO THE LAB : Get the blood work     Please go to the front desk: Arrange for a physical exam to be done in 4 months

## 2023-04-21 NOTE — Assessment & Plan Note (Signed)
 SQ lumps, flank pain, lower abdominal pain. See LOV, CT abdomen show aortic sclerosis and a R simple renal cyst, no indication for follow-up.  Still has occasional L flank pain.  Again offered to Ortho referral otherwise observation. Depression, GAD, panic attacks, insomnia: Well-controlled, PDMP okay, RF clonazepam and Ambien. High cholesterol: Last LFTs normal, continue Crestor, check FLP. RTC 4 months CPX

## 2023-04-24 ENCOUNTER — Telehealth: Payer: Self-pay | Admitting: Neurology

## 2023-04-24 NOTE — Telephone Encounter (Signed)
 FYI  Copied from CRM [id number]. Topic: General - Other >> Apr 23, 2023  4:51 PM Eunice Blase wrote: Reason for CRM: Pt returning call for lab result which were provided, pt stated he has Not been taking rosuvastatin regularly but will start. He will pick up medication doctor refill for him.

## 2023-07-28 ENCOUNTER — Telehealth: Payer: Self-pay | Admitting: Internal Medicine

## 2023-07-28 DIAGNOSIS — F411 Generalized anxiety disorder: Secondary | ICD-10-CM

## 2023-07-28 NOTE — Telephone Encounter (Unsigned)
 Copied from CRM [id number]. Topic: Clinical - Medication Refill >> Jul 28, 2023  1:13 PM [place] P wrote: Medication: clonazePAM  (KLONOPIN ) 0.5 MG tablet cyclobenzaprine  (FLEXERIL ) 10 MG tablet zolpidem  (AMBIEN ) 10 MG tablet  Has the patient contacted their pharmacy? Yes- pt was advise to contact PCP (Agent: If no, request that the patient contact the pharmacy for the refill. If patient does not wish to contact the pharmacy document the reason why and proceed with request.) (Agent: If yes, when and what did the pharmacy advise?)  This is the patient's preferred pharmacy:  [facility] [address] - [place], [place] - [address], [address] [place] [address] Phone: [phone] Fax: [phone]  Is this the correct pharmacy for this prescription? Yes If no, delete pharmacy and type the correct one.   Has the prescription been filled recently? No  Is the patient out of the medication? No- has 1 pill left for clonazepam   Has the patient been seen for an appointment in the last year OR does the patient have an upcoming appointment? Yes  Can we respond through MyChart? Yes  Agent: Please be advised that Rx refills may take up to 3 business days. We ask that you follow-up with your pharmacy.

## 2023-07-28 NOTE — Telephone Encounter (Signed)
 Requesting: clonazepam  and ambien  Contract: 07/04/22 UDS: 12/16/22 Last Visit: 04/21/23 Next Visit: 08/05/23 Last Refill: see med list   Please Advise

## 2023-07-29 MED ORDER — CLONAZEPAM 0.5 MG PO TABS: 0.5000 mg | ORAL_TABLET | Freq: Three times a day (TID) | ORAL | 1 refills | Status: DC | PRN

## 2023-07-29 MED ORDER — CYCLOBENZAPRINE HCL 10 MG PO TABS: ORAL_TABLET | ORAL | 1 refills | Status: DC

## 2023-07-29 MED ORDER — ZOLPIDEM TARTRATE 10 MG PO TABS: 5.0000 mg | ORAL_TABLET | Freq: Every evening | ORAL | 1 refills | Status: AC | PRN

## 2023-07-29 NOTE — Telephone Encounter (Signed)
PDMP okay, prescriptions sent 

## 2023-08-05 ENCOUNTER — Encounter: Payer: Self-pay | Admitting: Internal Medicine

## 2023-08-05 ENCOUNTER — Ambulatory Visit (INDEPENDENT_AMBULATORY_CARE_PROVIDER_SITE_OTHER): Admitting: Internal Medicine

## 2023-08-05 VITALS — BP 116/80 | HR 69 | Temp 98.0°F | Resp 16 | Ht 74.0 in | Wt 233.2 lb

## 2023-08-05 DIAGNOSIS — R739 Hyperglycemia, unspecified: Secondary | ICD-10-CM

## 2023-08-05 DIAGNOSIS — F32A Depression, unspecified: Secondary | ICD-10-CM

## 2023-08-05 DIAGNOSIS — E785 Hyperlipidemia, unspecified: Secondary | ICD-10-CM

## 2023-08-05 DIAGNOSIS — Z0000 Encounter for general adult medical examination without abnormal findings: Secondary | ICD-10-CM | POA: Diagnosis not present

## 2023-08-05 DIAGNOSIS — Z79899 Other long term (current) drug therapy: Secondary | ICD-10-CM | POA: Diagnosis not present

## 2023-08-05 DIAGNOSIS — F419 Anxiety disorder, unspecified: Secondary | ICD-10-CM

## 2023-08-05 DIAGNOSIS — Z2821 Immunization not carried out because of patient refusal: Secondary | ICD-10-CM

## 2023-08-05 NOTE — Patient Instructions (Signed)
   It was good to see you today.  Vaccines I recommend: Flu shot every fall, tetanus shot.  Please return the stool kit you are getting today  Watch your diet, try to decrease amount of sugary foods you eat.  Avoid commercial orange juice or apple juice.  Is very important you see a dentist    GO TO THE LAB :  Get the blood work   Your results will be posted on MyChart with my comments  Next office visit for a checkup in 6 months Please make an appointment before you leave today

## 2023-08-05 NOTE — Progress Notes (Unsigned)
 Subjective:    Patient ID: Gregory Gilmore, male    DOB: 1975/09/01, 48 y.o.   MRN: [id number]  DOS:  08/05/2023 Type of visit - description: CPX  Here for CPX. Feeling well. Denies chest pain or difficulty breathing.  Review of Systems   A 14 point review of systems is negative    Past Medical History:  Diagnosis Date   Anxiety and depression    GERD (gastroesophageal reflux disease)    Hyperglycemia 07/28/2012   Hyperlipidemia 07/28/2012    Past Surgical History:  Procedure Laterality Date   NO PAST SURGERIES     Social History   Socioeconomic History   Marital status: Married    Spouse name: Not on file   Number of children: 2   Years of education: 12   Highest education level: High school graduate  Occupational History   Occupation: Camera operator    Occupation: Corporate treasurer company  Tobacco Use   Smoking status: Every Day    Current packs/day: 1.00    Types: Cigarettes   Smokeless tobacco: Never   Tobacco comments:    1 ppd   Vaping Use   Vaping status: Some Days   Substances: Nicotine  Substance and Sexual Activity   Alcohol use: Yes    Comment: OCC   Drug use: No   Sexual activity: Yes    Comment: married  Other Topics Concern   Not on file  Social History Narrative   Original from [place] , father was Ridhaan Dreibelbis a pt of mine deceased ~ 02-17-12   Married, 2 children (one from wife)   finished HS, Airline pilot    Social Drivers of Corporate investment banker Strain: Not on Ship broker Insecurity: Not on file  Transportation Needs: Not on file  Physical Activity: Not on file  Stress: Not on file  Social Connections: Not on file  Intimate Partner Violence: Not on file    Current Outpatient Medications  Medication Instructions   clonazePAM  (KLONOPIN ) 0.5 mg, Oral, 3 times daily PRN   cyclobenzaprine  (FLEXERIL ) 10 MG tablet TAKE 1 TABLET BY MOUTH AT BEDTIME AS NEEDED FOR MUSCLE SPASM   rosuvastatin  (CRESTOR ) 40 mg, Oral, Daily at bedtime    zolpidem  (AMBIEN ) 5-10 mg, Oral, At bedtime PRN       Objective:   Physical Exam BP 116/80   Pulse 69   Temp 98 F (36.7 C) (Oral)   Resp 16   Ht 6' 2 (1.88 m)   Wt 233 lb 4 oz (105.8 kg)   SpO2 96%   BMI 29.95 kg/m  General: Well developed, NAD, BMI noted Neck: No  thyromegaly  HEENT:  Normocephalic . Face symmetric, atraumatic Lungs:  CTA B Normal respiratory effort, no intercostal retractions, no accessory muscle use. Heart: RRR,  no murmur.  Abdomen:  Not distended, soft, non-tender. No rebound or rigidity.   Lower extremities: no pretibial edema bilaterally  Skin: Exposed areas without rash. Not pale. Not jaundice Neurologic:  alert & oriented X3.  Speech normal, gait appropriate for age and unassisted Strength symmetric and appropriate for age.  Psych: Cognition and judgment appear intact.  Cooperative with normal attention span and concentration.  Behavior appropriate. No anxious or depressed appearing.     Assessment    ASSESSMENT last seen 2015, reestablish 02/18/2017 GERD Depression, GAD, panic attacks, insomnia (intolerant to Trintellix, Lexapro  sertraline  caused erectile dysfunction) High cholesterol CP : w/u 2014, 2015, cath 2017 @ [facility] , 2017 EGD >>  was concluded sx were d/t panic  CT angio 2015: (-) Sleep study 07-2021: No significant OSA,  Rx dental appliance to decrease his snoring Thoracic back pain: On chronic Flexeril  + FH Sarcoidosis, CAD  PLAN Here for CPX -Tdap 2014  -Covid vax: Has declined consistently - Recommend a flu shot every fall, Tdap. -CCS: No FH, failed to pursue previous screenings.  Again 3 options discussed, elected on a iFOB  -Prostate cancer screening: No increased risk, start  at age 24 -Tobacco: 1 pack a day, not ready to quit.  Does not see a dentist regularly, he thinks he already has some gum disease.  Strongly encouraged to see a general dentist. -Labs: see Order -Diet extensively discussed.  Other issues  addressed today Hyperglycemia: Last A1c 5.9, explained patient what A1c is, will recheck today, plans to improve his diet. Depression GAD panic attacks insomnia: Controlled with with clonazepam  and Ambien .  Check UDS High cholesterol:Last LDL was 199, he was not taking rosuvastatin  regularly, he is doing so now.  Checking FLP today.  Depending on results, he could stay on only Crestor  or we could add Zetia  which he has taken before. RTC 6 months

## 2023-08-06 ENCOUNTER — Encounter: Payer: Self-pay | Admitting: Internal Medicine

## 2023-08-06 LAB — CBC WITH DIFFERENTIAL/PLATELET
Basophils Absolute: 0.1 10*3/uL (ref 0.0–0.1)
Basophils Relative: 1 % (ref 0.0–3.0)
Eosinophils Absolute: 0.1 10*3/uL (ref 0.0–0.7)
Eosinophils Relative: 1.2 % (ref 0.0–5.0)
HCT: 45.8 % (ref 39.0–52.0)
Hemoglobin: 15.4 g/dL (ref 13.0–17.0)
Lymphocytes Relative: 40 % (ref 12.0–46.0)
Lymphs Abs: 2.7 10*3/uL (ref 0.7–4.0)
MCHC: 33.6 g/dL (ref 30.0–36.0)
MCV: 96.8 fl (ref 78.0–100.0)
Monocytes Absolute: 0.6 10*3/uL (ref 0.1–1.0)
Monocytes Relative: 8.9 % (ref 3.0–12.0)
Neutro Abs: 3.3 10*3/uL (ref 1.4–7.7)
Neutrophils Relative %: 48.9 % (ref 43.0–77.0)
Platelets: 223 10*3/uL (ref 150.0–400.0)
RBC: 4.73 Mil/uL (ref 4.22–5.81)
RDW: 12.8 % (ref 11.5–15.5)
WBC: 6.7 10*3/uL (ref 4.0–10.5)

## 2023-08-06 LAB — COMPREHENSIVE METABOLIC PANEL WITH GFR
ALT: 27 U/L (ref 0–53)
AST: 21 U/L (ref 0–37)
Albumin: 4.6 g/dL (ref 3.5–5.2)
Alkaline Phosphatase: 82 U/L (ref 39–117)
BUN: 16 mg/dL (ref 6–23)
CO2: 28 meq/L (ref 19–32)
Calcium: 9.2 mg/dL (ref 8.4–10.5)
Chloride: 103 meq/L (ref 96–112)
Creatinine, Ser: 0.99 mg/dL (ref 0.40–1.50)
GFR: 89.84 mL/min (ref >60.00)
Glucose, Bld: 80 mg/dL (ref 70–99)
Potassium: 4.1 meq/L (ref 3.5–5.1)
Sodium: 140 meq/L (ref 135–145)
Total Bilirubin: 1.1 mg/dL (ref 0.2–1.2)
Total Protein: 6.8 g/dL (ref 6.0–8.3)

## 2023-08-06 LAB — LIPID PANEL
Cholesterol: 203 mg/dL — ABNORMAL HIGH (ref 0–200)
HDL: 34.1 mg/dL — ABNORMAL LOW (ref >39.00)
LDL Cholesterol: 138 mg/dL — ABNORMAL HIGH (ref 0–99)
NonHDL: 169.33
Total CHOL/HDL Ratio: 6
Triglycerides: 158 mg/dL — ABNORMAL HIGH (ref 0.0–149.0)
VLDL: 31.6 mg/dL (ref 0.0–40.0)

## 2023-08-06 LAB — HEMOGLOBIN A1C: Hgb A1c MFr Bld: 6 % (ref 4.6–6.5)

## 2023-08-06 NOTE — Assessment & Plan Note (Signed)
 Here for CPX -Tdap 2014  -Covid vax: Has declined consistently - Recommend a flu shot every fall, Tdap. -CCS: No FH, failed to pursue previous screenings.  Again 3 options discussed, elected on a iFOB  -Prostate cancer screening: No increased risk, start  at age 48 -Tobacco: 1 pack a day, not ready to quit.  Does not see a dentist regularly, he thinks he already has some gum disease.  Strongly encouraged to see a general dentist. -Labs: see Order -Diet extensively discussed.

## 2023-08-06 NOTE — Assessment & Plan Note (Signed)
 Here for CPX  Other issues addressed today Hyperglycemia: Last A1c 5.9, explained patient what A1c is, will recheck today, plans to improve his diet. Depression GAD panic attacks insomnia: Controlled with with clonazepam  and Ambien .  Check UDS High cholesterol:Last LDL was 199, he was not taking rosuvastatin  regularly, he is doing so now.  Checking FLP today.  Depending on results, he could stay on only Crestor  or we could add Zetia  which he has taken before. RTC 6 months

## 2023-08-07 LAB — DM TEMPLATE

## 2023-08-08 LAB — DRUG MONITORING PANEL 375977 , URINE
Alcohol Metabolites: NEGATIVE ng/mL (ref <500)
Alphahydroxyalprazolam: NEGATIVE ng/mL (ref <25)
Alphahydroxymidazolam: NEGATIVE ng/mL (ref <50)
Alphahydroxytriazolam: NEGATIVE ng/mL (ref <50)
Aminoclonazepam: 206 ng/mL — ABNORMAL HIGH (ref <25)
Amphetamines: NEGATIVE ng/mL (ref <500)
Barbiturates: NEGATIVE ng/mL (ref <300)
Benzodiazepines: POSITIVE ng/mL — AB (ref <100)
Cocaine Metabolite: NEGATIVE ng/mL (ref <150)
Desmethyltramadol: NEGATIVE ng/mL (ref <100)
Hydroxyethylflurazepam: NEGATIVE ng/mL (ref <50)
Lorazepam: NEGATIVE ng/mL (ref <50)
Marijuana Metabolite: 162 ng/mL — ABNORMAL HIGH (ref <5)
Marijuana Metabolite: POSITIVE ng/mL — AB (ref <20)
Nordiazepam: NEGATIVE ng/mL (ref <50)
Opiates: NEGATIVE ng/mL (ref <100)
Oxazepam: NEGATIVE ng/mL (ref <50)
Oxycodone: NEGATIVE ng/mL (ref <100)
Temazepam: NEGATIVE ng/mL (ref <50)
Tramadol: NEGATIVE ng/mL (ref <100)

## 2023-08-08 LAB — DM TEMPLATE

## 2023-08-12 ENCOUNTER — Ambulatory Visit: Payer: Self-pay | Admitting: Internal Medicine

## 2023-08-12 MED ORDER — EZETIMIBE 10 MG PO TABS: 10.0000 mg | ORAL_TABLET | Freq: Every day | ORAL | 3 refills | Status: DC

## 2023-10-03 ENCOUNTER — Ambulatory Visit (INDEPENDENT_AMBULATORY_CARE_PROVIDER_SITE_OTHER): Payer: BC Managed Care – PPO | Admitting: Behavioral Health

## 2023-10-03 DIAGNOSIS — Z91199 Patient's noncompliance with other medical treatment and regimen due to unspecified reason: Secondary | ICD-10-CM

## 2023-10-03 NOTE — Progress Notes (Signed)
 Pt did not show for scheduled appt and did not provide 24 hour access as required. Additional fees to be assessed.

## 2023-11-17 ENCOUNTER — Ambulatory Visit: Admitting: Internal Medicine

## 2023-11-17 ENCOUNTER — Encounter: Payer: Self-pay | Admitting: Internal Medicine

## 2023-11-17 ENCOUNTER — Ambulatory Visit: Payer: Self-pay

## 2023-11-17 VITALS — BP 112/74 | HR 80 | Temp 98.1°F | Resp 16 | Ht 74.0 in | Wt 235.1 lb

## 2023-11-17 DIAGNOSIS — R5383 Other fatigue: Secondary | ICD-10-CM | POA: Diagnosis not present

## 2023-11-17 DIAGNOSIS — R229 Localized swelling, mass and lump, unspecified: Secondary | ICD-10-CM | POA: Diagnosis not present

## 2023-11-17 DIAGNOSIS — F419 Anxiety disorder, unspecified: Secondary | ICD-10-CM

## 2023-11-17 DIAGNOSIS — F321 Major depressive disorder, single episode, moderate: Secondary | ICD-10-CM | POA: Insufficient documentation

## 2023-11-17 NOTE — Progress Notes (Signed)
 Subjective:    Patient ID: Gregory Gilmore, male    DOB: June 03, 1975, 48 y.o.   MRN: [id number]  DOS:  11/17/2023 Acute  Discussed the use of AI scribe software for clinical note transcription with the patient, who gave verbal consent to proceed.  History of Present Illness To discuss a couple of issues  Has a lump at the left abdomen and flank.  It has become painful lately disrupting his sleep despite using Ambien .   Fatigue and decreased energy - Significant fatigue with lack of energy and interest in activities - Spends most weekends in bed - Previously very active  Mood disturbance and anxiety - Anxiety and depression managed with counseling and Klonopin  - Questions effectiveness of Klonopin  - Has lost pleasure of doing things - Intolerance to Trintellix, Lexapro , and sertraline  - 2023 sleep study showed no significant sleep apnea  Cardiac symptoms - No chest pain - Heart flutters attributed to anxiety - Normal cardiac catheterization in 2017  Laboratory and diagnostic findings - Blood work shows no anemia - Normal kidney function - Slightly elevated blood sugar without diabetes - Normal thyroid  function     Review of Systems See above   Past Medical History:  Diagnosis Date   Anxiety and depression    GERD (gastroesophageal reflux disease)    Hyperglycemia 07/28/2012   Hyperlipidemia 07/28/2012    Past Surgical History:  Procedure Laterality Date   NO PAST SURGERIES      Current Outpatient Medications  Medication Instructions   clonazePAM  (KLONOPIN ) 0.5 mg, Oral, 3 times daily PRN   cyclobenzaprine  (FLEXERIL ) 10 MG tablet TAKE 1 TABLET BY MOUTH AT BEDTIME AS NEEDED FOR MUSCLE SPASM   ezetimibe  (ZETIA ) 10 mg, Oral, Daily   rosuvastatin  (CRESTOR ) 40 mg, Oral, Daily at bedtime   zolpidem  (AMBIEN ) 5-10 mg, Oral, At bedtime PRN       Objective:   Physical Exam Skin:        Comments: SQ lumps, no TTP     BP 112/74   Pulse 80   Temp 98.1 F  (36.7 C) (Oral)   Resp 16   Ht 6' 2 (1.88 m)   Wt 235 lb 2 oz (106.7 kg)   SpO2 96%   BMI 30.19 kg/m  General:   Well developed, NAD, BMI noted. HEENT:  Normocephalic . Face symmetric, atraumatic Skin: Not pale. Not jaundice Neurologic:  alert & oriented X3.  Speech normal, gait appropriate for age and unassisted Psych--  Cognition and judgment appear intact.  Cooperative with normal attention span and concentration.  Behavior appropriate. No anxious or depressed appearing.      Assessment   ASSESSMENT last seen 2015, reestablish 02/18/2017 GERD Depression, GAD, panic attacks, insomnia (intolerant to Trintellix, Lexapro  sertraline  caused erectile dysfunction) High cholesterol CP : w/u 2014, 2015, cath 2017 @ [facility] , 2017 EGD >> was concluded sx were d/t panic  CT angio 2015: (-) Sleep study 07-2021: No significant OSA,  Rx dental appliance to decrease his snoring Thoracic back pain: On chronic Flexeril  + FH Sarcoidosis, CAD   Assessment & Plan Abdominal and flank sebaceous cysts These areas has been evaluated before, see office visit note from 12/16/2022, that led to a CT abdomen and pelvis with no acute findings.  The patient now reports pain and awareness of the areas.  Suspect they are sebaceous cysts. Plan: Referred to general surgery for consideration of an excision Fatigue: As described above, no obvious physical symptoms.  I believe this is  rather related to depression and GAD.  See next Major depressive disorder and anxiety disorder   PHQ-9: 12;  GAD: 10. Epworth scale: 5 (negative)   Symptoms not well-controlled, previous SSRI trials not well-tolerated.  - Continue working with behavioral health provider to explore alternative SSRIs or other medications. - Offer to recheck TSH levels to rule out thyroid  dysfunction. RTC routine checkup, 02/2024

## 2023-11-17 NOTE — Telephone Encounter (Signed)
 Appt scheduled

## 2023-11-17 NOTE — Assessment & Plan Note (Signed)
 Abdominal and flank sebaceous cysts These areas has been evaluated before, see office visit note from 12/16/2022, that led to a CT abdomen and pelvis with no acute findings.  The patient now reports pain and awareness of the areas.  Suspect they are sebaceous cysts. Plan: Referred to general surgery for consideration of an excision Fatigue: As described above, no obvious physical symptoms.  I believe this is rather related to depression and GAD.  See next Major depressive disorder and anxiety disorder   PHQ-9: 12;  GAD: 10. Epworth scale: 5 (negative)   Symptoms not well-controlled, previous SSRI trials not well-tolerated.  - Continue working with behavioral health provider to explore alternative SSRIs or other medications. - Offer to recheck TSH levels to rule out thyroid  dysfunction. RTC routine checkup, 02/2024

## 2023-11-17 NOTE — Patient Instructions (Signed)
 Go to the front desk for the checkout Please make an appointment for a follow-up 02/2024   We are referring you to the general surgeon.

## 2023-11-17 NOTE — Telephone Encounter (Signed)
 FYI Only or Action Required?: FYI only for provider.  Patient was last seen in primary care on 08/05/2023 by Amon Aloysius BRAVO, MD.  Called Nurse Triage reporting Cyst.  Symptoms began several months ago.  Interventions attempted: Nothing.  Symptoms are: gradually worsening.  Triage Disposition: See PCP When Office is Open (Within 3 Days)  Patient/caregiver understands and will follow disposition?: Yes  Copied from CRM [id number]. Topic: Clinical - Red Word Triage >> Nov 17, 2023  9:00 AM Fonda T wrote: [facility] that prompted transfer to Nurse Triage: Patient calling, states has a painful cyst on left side, waist area.   Patient reports has has worsening pain, causing difficulty sleeping. Reason for Disposition  [1] Small swelling or lump AND [2] unexplained AND [3] present > 1 week  Answer Assessment - Initial Assessment Questions 1. APPEARANCE of SWELLING: What does it look like?     There is a knot on my side  2. SIZE: How large is the swelling? (e.g., inches, cm; or compare to size of pinhead, tip of pen, eraser, coin, pea, grape, ping pong ball)      The size of a marble  3. LOCATION: Where is the swelling located?     ;eft side, waist area  4. ONSET: When did the swelling start?     Several months ago  5. COLOR: What color is it? Is there more than one color?     No  6. PAIN: Is there any pain? If Yes, ask: How bad is the pain? (Scale 1-10; or mild, moderate, severe)       5/10 pain  7. ITCH: Does it itch? If Yes, ask: How bad is the itch?      No  8. CAUSE: What do you think caused the swelling?     Unsure of cause  9 OTHER SYMPTOMS: Do you have any other symptoms? (e.g., fever)     No other symptoms  Protocols used: Skin Lump or Localized Swelling-A-AH

## 2023-11-28 ENCOUNTER — Ambulatory Visit: Payer: Self-pay | Admitting: General Surgery

## 2023-11-28 DIAGNOSIS — M7989 Other specified soft tissue disorders: Secondary | ICD-10-CM | POA: Diagnosis not present

## 2023-12-17 ENCOUNTER — Other Ambulatory Visit: Payer: Self-pay | Admitting: Internal Medicine

## 2023-12-26 ENCOUNTER — Other Ambulatory Visit: Payer: Self-pay | Admitting: Internal Medicine

## 2023-12-26 DIAGNOSIS — F411 Generalized anxiety disorder: Secondary | ICD-10-CM

## 2023-12-26 NOTE — Telephone Encounter (Signed)
 Requesting: clonazepam  0.5mg   Contract:07/04/22 UDS: 08/05/23 Last Visit: 11/17/23 Next Visit: 02/13/24 Last Refill: 07/29/23 #90 and 1RF   Please Advise

## 2024-02-13 ENCOUNTER — Ambulatory Visit: Admitting: Internal Medicine

## 2024-03-02 ENCOUNTER — Ambulatory Visit: Admitting: Internal Medicine

## 2024-03-02 VITALS — BP 117/70 | HR 92 | Temp 98.5°F | Resp 16 | Ht 74.0 in | Wt 237.0 lb

## 2024-03-02 DIAGNOSIS — R1013 Epigastric pain: Secondary | ICD-10-CM

## 2024-03-02 DIAGNOSIS — F321 Major depressive disorder, single episode, moderate: Secondary | ICD-10-CM | POA: Diagnosis not present

## 2024-03-02 DIAGNOSIS — K219 Gastro-esophageal reflux disease without esophagitis: Secondary | ICD-10-CM

## 2024-03-02 DIAGNOSIS — F419 Anxiety disorder, unspecified: Secondary | ICD-10-CM

## 2024-03-02 NOTE — Progress Notes (Addendum)
 "  Subjective:    Patient ID: Gregory Gilmore, male    DOB: Nov 05, 1975, 49 y.o.   MRN: [id number]  DOS:  03/02/2024 ER follow-up    Discussed the use of AI scribe software for clinical note transcription with the patient, who gave verbal consent to proceed.  History of Present Illness Gregory Gilmore is a 49 year old male with anxiety and depression who presents with chest pain and panic attacks. He is accompanied by his wife.  Chest pain and epigastric discomfort Went to the ER 02/23/2024: Arrived via EMS.  Chief complaint was chest pain. Troponin negative x 2, CMP CBC lipase: WNL.  Chest x-ray no acute. EKG reportedly no acute, sinus bradycardia They chest pain is actually epigastric pain, most commonly you start at rest, while sitting.  Then it spreads to the lower abdominal and upper chest bilaterally. No associated with physical activity.  No nausea, heartburn, difficulty breathing, diaphoresis or dysphagia. 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 Immediately as for the chest pain he become panicky which prompted the ER visit.    Anxiety and depression - Longstanding anxiety and depression - Limited benefit from prior trials of Trintellix and Buspar  - Has not seen [facility] - Increased anxiety with physical discomfort and difficulty tolerating sitting, per wife - Depression improved compared with prior years    Review of Systems See above   Past Medical History:  Diagnosis Date   Anxiety and depression    GERD (gastroesophageal reflux disease)    Hyperglycemia 07/28/2012   Hyperlipidemia 07/28/2012    Past Surgical History:  Procedure Laterality Date   NO PAST SURGERIES      Current Outpatient Medications  Medication Instructions   clonazePAM  (KLONOPIN ) 0.5 mg, Oral, 3 times daily PRN, for anxiety   cyclobenzaprine  (FLEXERIL ) 10 mg, Oral, At bedtime PRN   rosuvastatin  (CRESTOR ) 40 mg, Oral, Daily at bedtime    zolpidem  (AMBIEN ) 5-10 mg, Oral, At bedtime PRN       Objective:   Physical Exam BP 117/70 (BP Location: Right Arm, Patient Position: Sitting, Cuff Size: Large)   Pulse 92   Temp 98.5 F (36.9 C) (Oral)   Resp 16   Ht 6' 2 (1.88 m)   Wt 237 lb (107.5 kg)   SpO2 97%   BMI 30.43 kg/m  General:   Well developed, NAD, BMI noted.  HEENT:  Normocephalic . Face symmetric, atraumatic Lungs:  CTA B Normal respiratory effort, no intercostal retractions, no accessory muscle use. Heart: RRR,  no murmur.  Abdomen:  Not distended, soft, non-tender. No rebound or rigidity.   Skin: Not pale. Not jaundice Lower extremities: no pretibial edema bilaterally  Neurologic:  alert & oriented X3.  Speech normal, gait appropriate for age and unassisted Psych--  Cognition and judgment appear intact.  Cooperative with normal attention span and concentration.  Behavior appropriate. Moderately anxious but not depressed appearing.     Assessment     ASSESSMENT last seen 2015, reestablish 02/18/2017 GERD Depression, GAD, panic attacks, insomnia (intolerant to Trintellix, Lexapro  sertraline  caused erectile dysfunction) High cholesterol CP : w/u 2014, 2015, cath 2017 @ [facility] , 2017 EGD >> was concluded sx were d/t panic  CT angio 2015: (-) Sleep study 07-2021: No significant OSA,  Rx dental appliance to decrease his snoring Thoracic back pain: On chronic Flexeril  + FH Sarcoidosis, CAD    Assessment & Plan Major depressive disorder and anxiety disorder   Continue  with symptoms as described above, most prominent sx is chest pain. Has depression << anxiety with panic attacks. For some reason his symptoms usually started when patient is sitting down. Previous workup for coronary etiologies including a cardiac catheterization in 2017 were negative. Extensive listening therapy provided to the patient and his wife who is here. Plan: - due to atypical CP, will get ultrasound of the abdomen to rule out  gallbladder pathology. - Continue working with  psychiatry.  From my standpoint okay to take clonazepam  3 times a day and extra clonazepam  if needed, as long as he does not get sleepy. - Encouraged to discuss with psychiatry retrial with other medications that cause side effects in the past including SSRIs and BuSpar . - Continue omeprazole over-the-counter. Patient is concerned about vitamin levels, after long conversation I recommend a over-the-counter multivitamin noting that previously vitamin D  and B12 are normal.  Okay from my standpoint to take OTCs such as magnesium. Chest pain: see above Abdominal and flank sebaceous cysts Saw general surgery October 2025, they agreed to proceed with soft tissue lesion excisions, unable to proceed d/t cost RTC 3 to 4 months Time spent 40 minutes, chart review, extensive listening therapy about depression and anxiety and a conversation about vitamin supplements       "

## 2024-03-02 NOTE — Patient Instructions (Signed)
 Please read your instructions carefully.  Go to the front desk for the checkout Please make an appointment for a checkup with me in 3 to 4 months   We are ordering an ultrasound of your abdomen to be done in the first floor.  If they do not contact you in the next 2 or 3 days let me know  Okay to take clonazepam  3 times a day every day.  Okay to take extra clonazepam  now and then if you have a panic attack. Do not drive if you are sleepy  Continue working with [employer] about anxiety.  Consider BuSpar  or  re-try other medications that previously gave you side effects.  Continue taking omeprazole over-the-counter 1 tablet every day

## 2024-03-03 NOTE — Assessment & Plan Note (Addendum)
 Continue with symptoms as described above, most prominent sx is chest pain. Has depression << anxiety with panic attacks. For some reason his symptoms usually started when patient is sitting down. Previous workup for coronary etiologies including a cardiac catheterization in 2017 were negative. Extensive listening therapy provided to the patient and his wife who is here. Plan: - due to atypical CP, will get ultrasound of the abdomen to rule out gallbladder pathology. - Continue working with  psychiatry.  From my standpoint okay to take clonazepam  3 times a day and extra clonazepam  if needed, as long as he does not get sleepy. - Encouraged to discuss with psychiatry retrial with other medications that cause side effects in the past including SSRIs and BuSpar . - Continue omeprazole over-the-counter. Patient is concerned about vitamin levels, after long conversation I recommend a over-the-counter multivitamin noting that previously vitamin D  and B12 are normal.  Okay from my standpoint to take OTCs such as magnesium. Chest pain: see above Abdominal and flank sebaceous cysts Saw general surgery October 2025, they agreed to proceed with soft tissue lesion excisions, unable to proceed d/t cost RTC 3 to 4 months

## 2024-03-04 ENCOUNTER — Other Ambulatory Visit: Payer: Self-pay | Admitting: Internal Medicine

## 2024-03-10 ENCOUNTER — Ambulatory Visit (HOSPITAL_BASED_OUTPATIENT_CLINIC_OR_DEPARTMENT_OTHER)
Admission: RE | Admit: 2024-03-10 | Discharge: 2024-03-10 | Disposition: A | Source: Ambulatory Visit | Attending: Internal Medicine | Admitting: Internal Medicine

## 2024-03-10 DIAGNOSIS — R1013 Epigastric pain: Secondary | ICD-10-CM

## 2024-03-11 ENCOUNTER — Ambulatory Visit: Payer: Self-pay | Admitting: Internal Medicine

## 2024-03-29 ENCOUNTER — Ambulatory Visit: Admitting: Internal Medicine

## 2024-04-07 ENCOUNTER — Ambulatory Visit: Admitting: Behavioral Health

## 2024-06-25 ENCOUNTER — Ambulatory Visit: Admitting: Internal Medicine
